# Patient Record
Sex: Male | Born: 1990 | Race: White | Hispanic: No | Marital: Single | Smoking: Former smoker
Health system: Southern US, Community
[De-identification: ages and names within clinical notes are randomized; demographics above are authoritative.]

## PROBLEM LIST (undated history)

## (undated) DIAGNOSIS — M549 Dorsalgia, unspecified: Secondary | ICD-10-CM

## (undated) HISTORY — PX: HAND SURGERY: SHX662

## (undated) HISTORY — PX: BACK SURGERY: SHX140

---
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
## 2012-07-06 ENCOUNTER — Emergency Department: Payer: Self-pay

## 2016-07-09 ENCOUNTER — Emergency Department: Payer: Worker's Compensation

## 2016-07-09 ENCOUNTER — Encounter: Payer: Self-pay | Admitting: Emergency Medicine

## 2016-07-09 ENCOUNTER — Emergency Department
Admission: EM | Admit: 2016-07-09 | Discharge: 2016-07-09 | Disposition: A | Discharge status: Home / Self Care | Payer: Worker's Compensation | Attending: Emergency Medicine | Admitting: Emergency Medicine

## 2016-07-09 DIAGNOSIS — F1210 Cannabis abuse, uncomplicated: Secondary | ICD-10-CM | POA: Diagnosis not present

## 2016-07-09 DIAGNOSIS — Y939 Activity, unspecified: Secondary | ICD-10-CM | POA: Insufficient documentation

## 2016-07-09 DIAGNOSIS — S60031A Contusion of right middle finger without damage to nail, initial encounter: Secondary | ICD-10-CM | POA: Insufficient documentation

## 2016-07-09 DIAGNOSIS — S6000XA Contusion of unspecified finger without damage to nail, initial encounter: Secondary | ICD-10-CM

## 2016-07-09 DIAGNOSIS — M79641 Pain in right hand: Secondary | ICD-10-CM | POA: Diagnosis present

## 2016-07-09 DIAGNOSIS — Y929 Unspecified place or not applicable: Secondary | ICD-10-CM | POA: Insufficient documentation

## 2016-07-09 DIAGNOSIS — W231XXA Caught, crushed, jammed, or pinched between stationary objects, initial encounter: Secondary | ICD-10-CM | POA: Diagnosis not present

## 2016-07-09 DIAGNOSIS — Y999 Unspecified external cause status: Secondary | ICD-10-CM | POA: Insufficient documentation

## 2016-07-09 MED ORDER — OXYCODONE-ACETAMINOPHEN 5-325 MG PO TABS
1.0000 | ORAL_TABLET | ORAL | 0 refills | Status: DC | PRN
Start: 2016-07-09 — End: 2017-11-13

## 2016-07-09 MED ORDER — ACETAMINOPHEN 500 MG PO TABS
1000.0000 mg | ORAL_TABLET | Freq: Once | ORAL | Status: AC
  Administered 2016-07-09: 1000 mg via ORAL
  Filled 2016-07-09: qty 2

## 2016-07-09 MED ORDER — OXYCODONE HCL 5 MG PO TABS
5.0000 mg | ORAL_TABLET | Freq: Once | ORAL | Status: AC
  Administered 2016-07-09: 5 mg via ORAL
  Filled 2016-07-09: qty 1
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
## 2016-07-09 NOTE — Discharge Instructions (Signed)
Elevate your hand. Apply ice multiple times a day. Take 600 mg of ibuprofen every 6 hours. He may take 1 Percocet every 6 hours as needed for pain that is not well controlled with ibuprofen. Follow up with your doctor in 3 days.
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
## 2016-10-07 ENCOUNTER — Emergency Department
Admission: EM | Admit: 2016-10-07 | Discharge: 2016-10-07 | Disposition: A | Discharge status: Home / Self Care | Payer: No Typology Code available for payment source | Attending: Emergency Medicine | Admitting: Emergency Medicine

## 2016-10-07 ENCOUNTER — Encounter: Payer: Self-pay | Admitting: Medical Oncology

## 2016-10-07 ENCOUNTER — Emergency Department: Payer: No Typology Code available for payment source

## 2016-10-07 DIAGNOSIS — Y999 Unspecified external cause status: Secondary | ICD-10-CM | POA: Insufficient documentation

## 2016-10-07 DIAGNOSIS — S3992XA Unspecified injury of lower back, initial encounter: Secondary | ICD-10-CM | POA: Diagnosis present

## 2016-10-07 DIAGNOSIS — S60221A Contusion of right hand, initial encounter: Secondary | ICD-10-CM | POA: Insufficient documentation

## 2016-10-07 DIAGNOSIS — S161XXA Strain of muscle, fascia and tendon at neck level, initial encounter: Secondary | ICD-10-CM | POA: Diagnosis not present

## 2016-10-07 DIAGNOSIS — Y92410 Unspecified street and highway as the place of occurrence of the external cause: Secondary | ICD-10-CM | POA: Insufficient documentation

## 2016-10-07 DIAGNOSIS — Y939 Activity, unspecified: Secondary | ICD-10-CM | POA: Insufficient documentation

## 2016-10-07 DIAGNOSIS — S39012A Strain of muscle, fascia and tendon of lower back, initial encounter: Secondary | ICD-10-CM | POA: Diagnosis not present

## 2016-10-07 MED ORDER — CYCLOBENZAPRINE HCL 10 MG PO TABS: 10.0000 mg | ORAL_TABLET | Freq: Three times a day (TID) | ORAL | 0 refills | Status: DC | PRN

## 2016-10-07 MED ORDER — OXYCODONE-ACETAMINOPHEN 5-325 MG PO TABS
1.0000 | ORAL_TABLET | Freq: Four times a day (QID) | ORAL | 0 refills | Status: DC | PRN
Start: 2016-10-07 — End: 2017-11-13

## 2016-10-07 MED ORDER — IBUPROFEN 600 MG PO TABS
600.0000 mg | ORAL_TABLET | Freq: Once | ORAL | Status: AC
  Administered 2016-10-07: 600 mg via ORAL
  Filled 2016-10-07: qty 1

## 2016-10-07 MED ORDER — IBUPROFEN 600 MG PO TABS: 600.0000 mg | ORAL_TABLET | Freq: Three times a day (TID) | ORAL | 0 refills | Status: DC | PRN

## 2016-10-07 MED ORDER — CYCLOBENZAPRINE HCL 10 MG PO TABS
10.0000 mg | ORAL_TABLET | Freq: Once | ORAL | Status: AC
  Administered 2016-10-07: 10 mg via ORAL
  Filled 2016-10-07: qty 1

## 2016-10-07 MED ORDER — IBUPROFEN 600 MG PO TABS
ORAL_TABLET | ORAL | Status: AC
  Filled 2016-10-07: qty 1

## 2016-10-07 MED ORDER — OXYCODONE-ACETAMINOPHEN 5-325 MG PO TABS
1.0000 | ORAL_TABLET | Freq: Once | ORAL | Status: AC
  Administered 2016-10-07: 1 via ORAL
  Filled 2016-10-07: qty 1
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
## 2016-10-07 NOTE — Discharge Instructions (Signed)
Wear arm sling for 2-3 days.

## 2016-10-10 ENCOUNTER — Encounter: Payer: Self-pay | Admitting: Medical Oncology

## 2016-10-10 ENCOUNTER — Emergency Department
Admission: EM | Admit: 2016-10-10 | Discharge: 2016-10-10 | Disposition: A | Discharge status: Home / Self Care | Payer: Managed Care, Other (non HMO) | Attending: Emergency Medicine | Admitting: Emergency Medicine

## 2016-10-10 DIAGNOSIS — Y9389 Activity, other specified: Secondary | ICD-10-CM | POA: Diagnosis not present

## 2016-10-10 DIAGNOSIS — Y999 Unspecified external cause status: Secondary | ICD-10-CM | POA: Diagnosis not present

## 2016-10-10 DIAGNOSIS — M62830 Muscle spasm of back: Secondary | ICD-10-CM

## 2016-10-10 DIAGNOSIS — Y92410 Unspecified street and highway as the place of occurrence of the external cause: Secondary | ICD-10-CM | POA: Insufficient documentation

## 2016-10-10 DIAGNOSIS — S3992XA Unspecified injury of lower back, initial encounter: Secondary | ICD-10-CM | POA: Diagnosis present

## 2016-10-10 MED ORDER — LIDOCAINE 5 % EX PTCH: 1.0000 | MEDICATED_PATCH | Freq: Two times a day (BID) | CUTANEOUS | 0 refills | Status: DC

## 2016-10-10 MED ORDER — KETOROLAC TROMETHAMINE 30 MG/ML IJ SOLN
30.0000 mg | Freq: Once | INTRAMUSCULAR | Status: AC
  Administered 2016-10-10: 30 mg via INTRAVENOUS
  Filled 2016-10-10: qty 1

## 2016-10-10 MED ORDER — MELOXICAM 15 MG PO TABS: 15.0000 mg | ORAL_TABLET | Freq: Every day | ORAL | 0 refills | Status: DC

## 2016-10-10 MED ORDER — ORPHENADRINE CITRATE 30 MG/ML IJ SOLN
60.0000 mg | Freq: Once | INTRAMUSCULAR | Status: AC
  Administered 2016-10-10: 60 mg via INTRAVENOUS
  Filled 2016-10-10: qty 2

## 2016-10-10 MED ORDER — METHOCARBAMOL 500 MG PO TABS: 500.0000 mg | ORAL_TABLET | Freq: Four times a day (QID) | ORAL | 0 refills | Status: DC
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
## 2017-10-23 ENCOUNTER — Emergency Department
Admission: EM | Admit: 2017-10-23 | Discharge: 2017-10-23 | Disposition: A | Discharge status: Home / Self Care | Payer: Worker's Compensation | Attending: Emergency Medicine | Admitting: Emergency Medicine

## 2017-10-23 ENCOUNTER — Other Ambulatory Visit: Payer: Self-pay

## 2017-10-23 ENCOUNTER — Encounter: Payer: Self-pay | Admitting: Emergency Medicine

## 2017-10-23 DIAGNOSIS — Y990 Civilian activity done for income or pay: Secondary | ICD-10-CM | POA: Insufficient documentation

## 2017-10-23 DIAGNOSIS — Z79899 Other long term (current) drug therapy: Secondary | ICD-10-CM | POA: Insufficient documentation

## 2017-10-23 DIAGNOSIS — X509XXA Other and unspecified overexertion or strenuous movements or postures, initial encounter: Secondary | ICD-10-CM | POA: Diagnosis not present

## 2017-10-23 DIAGNOSIS — S39012A Strain of muscle, fascia and tendon of lower back, initial encounter: Secondary | ICD-10-CM | POA: Diagnosis not present

## 2017-10-23 DIAGNOSIS — Y9389 Activity, other specified: Secondary | ICD-10-CM | POA: Insufficient documentation

## 2017-10-23 DIAGNOSIS — S3992XA Unspecified injury of lower back, initial encounter: Secondary | ICD-10-CM | POA: Diagnosis present

## 2017-10-23 DIAGNOSIS — Y9289 Other specified places as the place of occurrence of the external cause: Secondary | ICD-10-CM | POA: Diagnosis not present

## 2017-10-23 MED ORDER — ORPHENADRINE CITRATE 30 MG/ML IJ SOLN
60.0000 mg | Freq: Once | INTRAMUSCULAR | Status: AC
  Administered 2017-10-23: 60 mg via INTRAMUSCULAR
  Filled 2017-10-23: qty 2

## 2017-10-23 MED ORDER — KETOROLAC TROMETHAMINE 30 MG/ML IJ SOLN
30.0000 mg | Freq: Once | INTRAMUSCULAR | Status: AC
  Administered 2017-10-23: 30 mg via INTRAMUSCULAR
  Filled 2017-10-23: qty 1

## 2017-10-23 MED ORDER — MELOXICAM 15 MG PO TABS: 15.0000 mg | ORAL_TABLET | Freq: Every day | ORAL | 0 refills | Status: DC

## 2017-10-23 MED ORDER — OXYCODONE-ACETAMINOPHEN 5-325 MG PO TABS
1.0000 | ORAL_TABLET | Freq: Once | ORAL | Status: AC
  Administered 2017-10-23: 1 via ORAL
  Filled 2017-10-23: qty 1

## 2017-10-23 MED ORDER — METHOCARBAMOL 500 MG PO TABS: 500.0000 mg | ORAL_TABLET | Freq: Four times a day (QID) | ORAL | 0 refills | Status: DC

## 2017-10-23 NOTE — ED Notes (Signed)
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
## 2017-10-23 NOTE — ED Provider Notes (Signed)
[facility] Emergency Department Provider Note  ____________________________________________  Time seen: Approximately 10:30 PM  I have reviewed the triage vital signs and the nursing notes.   HISTORY  Chief Complaint Back Pain    HPI Chris Foster is a 26 y.o. male who presents the emergency department complaining of lower back pain with radicular symptoms down the right leg.  Patient reports that he was at work lifting some heavy tires when he felt some lower back pain.  Patient reports that the symptoms occur on a regular basis, but typically resolve after sitting down for a few minutes.  Patient reports that he continue to work on the vehicle when he had a worsening of his symptoms.  Patient reports that pain is primarily on the left side but he has radicular symptoms down the right leg.  Patient denies any bowel or bladder dysfunction, saddle anesthesia, paresthesias.  No direct trauma to lower back.  Patient has not had any medications for this complaint prior to arrival.  No other complaints at this time.  History reviewed. No pertinent past medical history.  There are no active problems to display for this patient.   Past Surgical History:  Procedure Laterality Date  . HAND SURGERY      Prior to Admission medications   Medication Sig Start Date End Date Taking? Authorizing Provider  cyclobenzaprine (FLEXERIL) 10 MG tablet Take 1 tablet (10 mg total) by mouth 3 (three) times daily as needed. 10/07/16   Joni ReiningSmith, Ronald K, PA-C  ibuprofen (ADVIL,MOTRIN) 600 MG tablet Take 1 tablet (600 mg total) by mouth every 8 (eight) hours as needed. 10/07/16   Joni ReiningSmith, Ronald K, PA-C  lidocaine (LIDODERM) 5 % Place 1 patch onto the skin every 12 (twelve) hours. Remove & Discard patch within 12 hours or as directed by MD 10/10/16   Gladine Plude, Delorise RoyalsJonathan D, PA-C  meloxicam (MOBIC) 15 MG tablet Take 1 tablet (15 mg total) daily by mouth. 10/23/17   Lavaya Defreitas, Delorise RoyalsJonathan D,  PA-C  methocarbamol (ROBAXIN) 500 MG tablet Take 1 tablet (500 mg total) 4 (four) times daily by mouth. 10/23/17   Benigno Check, Delorise RoyalsJonathan D, PA-C  oxyCODONE-acetaminophen (ROXICET) 5-325 MG tablet Take 1 tablet by mouth every 4 (four) hours as needed for severe pain. 07/09/16   Nita SickleVeronese, [place], MD  oxyCODONE-acetaminophen (ROXICET) 5-325 MG tablet Take 1 tablet by mouth every 6 (six) hours as needed for severe pain. 10/07/16   Joni ReiningSmith, Ronald K, PA-C    Allergies Hydrocodone  No family history on file.  Social History Social History   Tobacco Use  . Smoking status: Never Smoker  . Smokeless tobacco: Never Used  Substance Use Topics  . Alcohol use: No  . Drug use: Yes    Types: Marijuana     Review of Systems  Constitutional: No fever/chills Eyes: No visual changes.  Cardiovascular: no chest pain. Respiratory: no cough. No SOB. Gastrointestinal: No abdominal pain.  No nausea, no vomiting.  No diarrhea.  No constipation. Genitourinary: Negative for dysuria. No hematuria Musculoskeletal: Positive for lower back pain Skin: Negative for rash, abrasions, lacerations, ecchymosis. Neurological: Negative for headaches, focal weakness or numbness. 10-point ROS otherwise negative.  ____________________________________________   PHYSICAL EXAM:  VITAL SIGNS: ED Triage Vitals  Enc Vitals Group     BP 10/23/17 2032 108/73     Pulse Rate 10/23/17 2032 91     Resp 10/23/17 2032 20     Temp 10/23/17 2032 97.7 F (36.5 C)  Temp Source 10/23/17 2032 Oral     SpO2 10/23/17 2032 97 %     Weight 10/23/17 2033 150 lb (68 kg)     Height 10/23/17 2033 5\' 11"  (1.803 m)     Head Circumference --      Peak Flow --      Pain Score 10/23/17 2032 7     Pain Loc --      Pain Edu? --      Excl. in GC? --      Constitutional: Alert and oriented. Well appearing and in no acute distress. Eyes: Conjunctivae are normal. PERRL. EOMI. Head: Atraumatic. Neck: No stridor.    Cardiovascular:  Normal rate, regular rhythm. Normal S1 and S2.  Good peripheral circulation. Respiratory: Normal respiratory effort without tachypnea or retractions. Lungs CTAB. Good air entry to the bases with no decreased or absent breath sounds. Gastrointestinal: Bowel sounds 4 quadrants. Soft and nontender to palpation. No guarding or rigidity. No palpable masses. No distention. No CVA tenderness. Musculoskeletal: Full range of motion to all extremities. No gross deformities appreciated.  No deformity of the spine upon inspection.  No signs of trauma.  Full range of motion to the lumbar spine.  Patient is diffusely tender to palpation throughout the lumbar spine region but no palpable abnormality or step-off.  No specific point tenderness.  Patient has appreciable spasms to the left paraspinal muscle group with extreme tenderness to palpation.  Patient is nontender to palpation of right paraspinal muscle groups with no appreciable spasms.  No tenderness to palpation of her bilateral sciatic notches.  Negative straight leg raise bilaterally.  Dorsalis pedis pulse and sensation intact bilateral lower extremities. Neurologic:  Normal speech and language. No gross focal neurologic deficits are appreciated.  Skin:  Skin is warm, dry and intact. No rash noted. Psychiatric: Mood and affect are normal. Speech and behavior are normal. Patient exhibits appropriate insight and judgement.   ____________________________________________   LABS (all labs ordered are listed, but only abnormal results are displayed)  Labs Reviewed - No data to display ____________________________________________  EKG   ____________________________________________  RADIOLOGY   No results found.  ____________________________________________    PROCEDURES  Procedure(s) performed:    Procedures    Medications  ketorolac (TORADOL) 30 MG/ML injection 30 mg (not administered)  orphenadrine (NORFLEX) injection 60 mg (not  administered)  oxyCODONE-acetaminophen (PERCOCET/ROXICET) 5-325 MG per tablet 1 tablet (not administered)     ____________________________________________   INITIAL IMPRESSION / ASSESSMENT AND PLAN / ED COURSE  Pertinent labs & imaging results that were available during my care of the patient were reviewed by me and considered in my medical decision making (see chart for details).  Review of the [place] CSRS was performed in accordance of the [facility] prior to dispensing any controlled drugs.     Patient's diagnosis is consistent with lumbar strain.  Patient presents with diffuse lower back pain status post lifting heavy items at work.  Spasms are appreciated on exam.  No indication at this time for imaging.  No concerning signs or symptoms warranting imaging.  Patient is given Toradol and muscle relaxer injection in the emergency department.. Patient will be discharged home with prescriptions for meloxicam and Robaxin for symptom control. Patient is to follow up with primary care as needed or otherwise directed. Patient is given ED precautions to return to the ED for any worsening or new symptoms.     ____________________________________________  FINAL CLINICAL IMPRESSION(S) / ED DIAGNOSES  Final diagnoses:  Strain of lumbar region, initial encounter      NEW MEDICATIONS STARTED DURING THIS VISIT:  ED Discharge Orders        Ordered    meloxicam (MOBIC) 15 MG tablet  Daily     10/23/17 2243    methocarbamol (ROBAXIN) 500 MG tablet  4 times daily     10/23/17 2243          This chart was dictated using voice recognition software/Dragon. Despite best efforts to proofread, errors can occur which can change the meaning. Any change was purely unintentional.    Racheal PatchesCuthriell, Tyr Franca D, PA-C 10/23/17 2255    Arnaldo NatalMalinda, Paul F, MD 10/23/17 [phone]

## 2017-11-07 ENCOUNTER — Ambulatory Visit: Payer: Worker's Compensation | Attending: Physician Assistant | Admitting: Physical Therapy

## 2017-11-07 ENCOUNTER — Other Ambulatory Visit: Payer: Self-pay

## 2017-11-07 ENCOUNTER — Encounter: Payer: Self-pay | Admitting: Physical Therapy

## 2017-11-07 DIAGNOSIS — M6281 Muscle weakness (generalized): Secondary | ICD-10-CM | POA: Diagnosis present

## 2017-11-07 DIAGNOSIS — R262 Difficulty in walking, not elsewhere classified: Secondary | ICD-10-CM

## 2017-11-07 DIAGNOSIS — M545 Low back pain: Secondary | ICD-10-CM | POA: Diagnosis not present
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
## 2017-11-09 ENCOUNTER — Ambulatory Visit: Payer: Worker's Compensation | Attending: Physician Assistant | Admitting: Physical Therapy

## 2017-11-09 ENCOUNTER — Encounter: Payer: Self-pay | Admitting: Physical Therapy

## 2017-11-09 DIAGNOSIS — M6281 Muscle weakness (generalized): Secondary | ICD-10-CM | POA: Insufficient documentation

## 2017-11-09 DIAGNOSIS — M545 Low back pain: Secondary | ICD-10-CM | POA: Diagnosis not present

## 2017-11-09 DIAGNOSIS — R262 Difficulty in walking, not elsewhere classified: Secondary | ICD-10-CM | POA: Diagnosis present
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
## 2017-11-13 ENCOUNTER — Other Ambulatory Visit: Payer: Self-pay

## 2017-11-13 ENCOUNTER — Emergency Department
Admission: EM | Admit: 2017-11-13 | Discharge: 2017-11-13 | Disposition: A | Discharge status: Home / Self Care | Payer: Worker's Compensation | Attending: Emergency Medicine | Admitting: Emergency Medicine

## 2017-11-13 ENCOUNTER — Emergency Department: Payer: Worker's Compensation

## 2017-11-13 ENCOUNTER — Encounter: Payer: Self-pay | Admitting: Emergency Medicine

## 2017-11-13 ENCOUNTER — Encounter: Payer: Self-pay | Admitting: Physical Therapy

## 2017-11-13 ENCOUNTER — Ambulatory Visit: Payer: Worker's Compensation | Attending: Physician Assistant

## 2017-11-13 VITALS — BP 108/69 | HR 114

## 2017-11-13 DIAGNOSIS — M6281 Muscle weakness (generalized): Secondary | ICD-10-CM | POA: Diagnosis present

## 2017-11-13 DIAGNOSIS — X58XXXD Exposure to other specified factors, subsequent encounter: Secondary | ICD-10-CM | POA: Insufficient documentation

## 2017-11-13 DIAGNOSIS — Z79899 Other long term (current) drug therapy: Secondary | ICD-10-CM | POA: Diagnosis not present

## 2017-11-13 DIAGNOSIS — R262 Difficulty in walking, not elsewhere classified: Secondary | ICD-10-CM | POA: Diagnosis present

## 2017-11-13 DIAGNOSIS — S39012D Strain of muscle, fascia and tendon of lower back, subsequent encounter: Secondary | ICD-10-CM | POA: Diagnosis not present

## 2017-11-13 DIAGNOSIS — M545 Low back pain: Secondary | ICD-10-CM | POA: Diagnosis not present

## 2017-11-13 MED ORDER — BACLOFEN 10 MG PO TABS: 10.0000 mg | ORAL_TABLET | Freq: Every day | ORAL | 1 refills | Status: DC

## 2017-11-13 MED ORDER — PREDNISONE 10 MG (48) PO TBPK: ORAL_TABLET | ORAL | 0 refills | Status: DC

## 2017-11-13 MED ORDER — TRAMADOL HCL 50 MG PO TABS: 50.0000 mg | ORAL_TABLET | Freq: Four times a day (QID) | ORAL | 0 refills | Status: DC | PRN

## 2017-11-13 MED ORDER — MORPHINE SULFATE (PF) 4 MG/ML IV SOLN
4.0000 mg | Freq: Once | INTRAVENOUS | Status: AC
  Administered 2017-11-13: 4 mg via INTRAVENOUS
  Filled 2017-11-13: qty 1

## 2017-11-13 NOTE — ED Provider Notes (Signed)
[facility] Emergency Department Provider Note  ____________________________________________   First MD Initiated Contact with Patient 11/13/17 1127     (approximate)  I have reviewed the triage vital signs and the nursing notes.   HISTORY  Chief Complaint Back Pain    HPI Chris Foster is a 26 y.o. 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 No pertinent past medical history.  There are no active problems to display for this patient.   Past Surgical History:  Procedure Laterality Date  . HAND SURGERY      Prior to Admission medications   Medication Sig Start Date End Date Taking? Authorizing Provider  baclofen (LIORESAL) 10 MG tablet Take 1 tablet (10 mg total) by mouth daily. 11/13/17 11/13/18  Faythe GheeFisher, Ireland Chagnon W, PA-C  meloxicam (MOBIC) 15 MG tablet Take 1 tablet (15 mg total) daily by mouth. 10/23/17   Cuthriell, Delorise RoyalsJonathan D, PA-C  methocarbamol (ROBAXIN) 500 MG tablet Take 1 tablet (500 mg total) 4 (four) times daily by mouth. 10/23/17   Cuthriell, Delorise RoyalsJonathan D, PA-C  predniSONE (STERAPRED UNI-PAK 48 TAB) 10 MG (48) TBPK tablet Take 6 pills for 2 days then decrease by 1 pill every 2 days 11/13/17   Faythe GheeFisher, Rubylee Zamarripa W, PA-C  traMADol (ULTRAM) 50 MG tablet Take 1 tablet (50 mg total) by mouth every 6 (six) hours as needed. 11/13/17   Faythe GheeFisher, Dae Highley W, PA-C    Allergies Hydrocodone and Toradol [ketorolac tromethamine]  No  family history on file.  Social History Social History   Tobacco Use  . Smoking status: Never Smoker  . Smokeless tobacco: Never Used  Substance Use Topics  . Alcohol use: No  . Drug use: Yes    Types: Marijuana    Review of Systems  Constitutional: No fever/chills, no change in speech patterns, states his entire left side went numb Eyes: No visual changes. ENT: No sore throat. Respiratory: Denies cough Genitourinary: Negative for dysuria. Musculoskeletal: Positive for back pain. Skin: Negative for rash.    ____________________________________________   PHYSICAL EXAM:  VITAL SIGNS: ED Triage Vitals [11/13/17 1026]  Enc Vitals Group     BP 112/75     Pulse Rate (!) 108     Resp 18     Temp 99.1 F (37.3 C)     Temp Source Oral     SpO2 97 %     Weight 150 lb (68 kg)     Height 5\' 11"  (1.803 m)     Head Circumference      Peak Flow      Pain Score 9     Pain Loc      Pain Edu?      Excl. in GC?     Constitutional: Alert and oriented. Well appearing and in no acute distress. Eyes: Conjunctivae are normal.  Head: Atraumatic. Nose: No congestion/rhinnorhea. Mouth/Throat: Mucous membranes are moist.   Cardiovascular: Normal  rate, regular rhythm. Heart sounds are normal Respiratory: Normal respiratory effort.  No retractions lungs are clear to auscultation GU: deferred Musculoskeletal: Vision has decreased range of motion, pain is reproduced by rolling onto the left side for spine examination, patient has weak efforts in pressing on hands with his feet, is able to dorsiflex his toes,  Neurologic:  Normal speech and language.  Skin:  Skin is warm, dry and intact. No rash noted. Psychiatric: Mood and affect are normal. Speech and behavior are normal.  ____________________________________________   LABS (all labs ordered are listed, but only abnormal results are displayed)  Labs Reviewed - No data to  display ____________________________________________   ____________________________________________  RADIOLOGY  X-ray lumbar spine is negative MRI of lumbar spine is ordered ----------------------------------------- 1:56 PM on 11/13/2017 -----------------------------------------  MRI of the lumbar spine is negative  ____________________________________________   PROCEDURES  Procedure(s) performed: No      ____________________________________________   INITIAL IMPRESSION / ASSESSMENT AND PLAN / ED COURSE  Pertinent labs & imaging results that were available during my care of the patient were reviewed by me and considered in my medical decision making (see chart for details).  Patient was sent to the emergency department by physical therapy via EMS due to patient going numb on his left side, x-ray of the lumbar spine is negative, MRI of lumbar spine is ordered, ----------------------------------------- 1:56 PM on 11/13/2017 -----------------------------------------  Patient is alert and well, he had pain relief with 4 mg of morphine, MRI and x-rays are negative, prescription for Sterapred DS 10 mg 12 day Dosepak was given, baclofen 10 mg 3 times a day, tramadol 50 mg #30 with no refill was given, patient was given a referral for orthopedics, explained to him that he needs to check with Worker's Comp. to make sure they will approve him using the orthopedic he was referred to, he can follow-up with the Worker's Comp. clinic [place];      ____________________________________________   FINAL CLINICAL IMPRESSION(S) / ED DIAGNOSES  Final diagnoses:  Strain of lumbar region, subsequent encounter      NEW MEDICATIONS STARTED DURING THIS VISIT:  This SmartLink is deprecated. Use AVSMEDLIST instead to display the medication list for a patient.   Note:  This document was prepared using Dragon voice recognition software and may include unintentional dictation  errors.    Faythe GheeFisher, Stella Encarnacion W, PA-C 11/13/17 1358    Jene EveryKinner, Robert, MD 11/13/17 [phone]

## 2017-11-13 NOTE — ED Triage Notes (Addendum)
Pt arrived via EMS from PT for severe back pain. Pt reports PT was pressing on lower spine and his left side went numb. Pt reports feeling has returned with the exception of left foot and left hand. Pt reports injured back three weeks ago and has been going to PT. Pt reports was feeling dizzy at PT while waiting for EMS to get there. No apparent distress in triage, joking with girlfriend during triage.

## 2017-11-13 NOTE — Discharge Instructions (Signed)
Follow-up with Dr.Menz or an orthopedic of your Worker's Comp. Choice, use the medication as prescribed, your x-ray and MRI are negative, you may return to physical therapy as needed, apply ice to your back

## 2017-11-13 NOTE — ED Notes (Signed)
Brought back from MRI

## 2017-11-13 NOTE — ED Notes (Signed)
See triage note  Presents with some numbness to left side  States sx's started while at PT this am  States he felt this numbness to left side after being massaged by PT  States feeling is returning at left side  Initial injury to back was 11/12 at work
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
## 2017-11-13 NOTE — ED Notes (Signed)
Taken to MRI via stretcher 

## 2017-11-13 NOTE — ED Notes (Signed)
Pt says pain about 3/10.  In nad.

## 2017-11-13 NOTE — ED Notes (Signed)
Awaiting ride

## 2017-11-15 ENCOUNTER — Encounter: Payer: Self-pay | Admitting: Physical Therapy

## 2017-11-15 ENCOUNTER — Ambulatory Visit: Payer: Worker's Compensation | Admitting: Physical Therapy

## 2017-11-15 DIAGNOSIS — R262 Difficulty in walking, not elsewhere classified: Secondary | ICD-10-CM

## 2017-11-15 DIAGNOSIS — M6281 Muscle weakness (generalized): Secondary | ICD-10-CM

## 2017-11-15 DIAGNOSIS — M545 Low back pain: Secondary | ICD-10-CM
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
## 2018-04-02 ENCOUNTER — Encounter: Payer: Self-pay | Admitting: Emergency Medicine

## 2018-04-02 ENCOUNTER — Emergency Department
Admission: EM | Admit: 2018-04-02 | Discharge: 2018-04-02 | Disposition: A | Discharge status: Home / Self Care | Payer: Self-pay | Attending: Emergency Medicine | Admitting: Emergency Medicine

## 2018-04-02 DIAGNOSIS — S025XXA Fracture of tooth (traumatic), initial encounter for closed fracture: Secondary | ICD-10-CM | POA: Insufficient documentation

## 2018-04-02 DIAGNOSIS — Y929 Unspecified place or not applicable: Secondary | ICD-10-CM | POA: Insufficient documentation

## 2018-04-02 DIAGNOSIS — Y998 Other external cause status: Secondary | ICD-10-CM | POA: Insufficient documentation

## 2018-04-02 DIAGNOSIS — Z79899 Other long term (current) drug therapy: Secondary | ICD-10-CM | POA: Insufficient documentation

## 2018-04-02 DIAGNOSIS — K0889 Other specified disorders of teeth and supporting structures: Secondary | ICD-10-CM

## 2018-04-02 DIAGNOSIS — Y9389 Activity, other specified: Secondary | ICD-10-CM | POA: Insufficient documentation

## 2018-04-02 DIAGNOSIS — Y33XXXA Other specified events, undetermined intent, initial encounter: Secondary | ICD-10-CM | POA: Insufficient documentation

## 2018-04-02 MED ORDER — IBUPROFEN 600 MG PO TABS: 600.0000 mg | ORAL_TABLET | Freq: Three times a day (TID) | ORAL | 0 refills | Status: DC | PRN

## 2018-04-02 MED ORDER — TRAMADOL HCL 50 MG PO TABS: 50.0000 mg | ORAL_TABLET | Freq: Two times a day (BID) | ORAL | 0 refills | Status: DC | PRN

## 2018-04-02 MED ORDER — AMOXICILLIN 500 MG PO CAPS: 500.0000 mg | ORAL_CAPSULE | Freq: Three times a day (TID) | ORAL | 0 refills | Status: DC

## 2018-04-02 NOTE — ED Provider Notes (Signed)
[facility] Emergency Department Provider Note   ____________________________________________   First MD Initiated Contact with Patient 04/02/18 1512     (approximate)  I have reviewed the triage vital signs and the nursing notes.   HISTORY  Chief Complaint Dental Pain    HPI Chris Foster is a 27 y.o. male patient complained of dental pain secondary to old fractured tooth right upper molar area.  Patient state pain comes and go but today he contused his face against elbow while changing a tire.  Patient rates the pain as a 10/10.  Patient described the pain as "acute aching".  No palates measured for complaint.  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  History reviewed. No pertinent past medical history.  There are no active problems to display for this patient.   Past Surgical History:  Procedure Laterality Date  . HAND SURGERY      Prior to Admission medications   Medication Sig Start Date End Date Taking? Authorizing Provider  amoxicillin (AMOXIL) 500 MG capsule Take 1 capsule (500 mg total) by mouth 3 (three) times daily. 04/02/18   Joni ReiningSmith, Cassandria Drew K, PA-C  baclofen (LIORESAL) 10 MG tablet Take 1 tablet (10 mg total) by mouth daily. 11/13/17 11/13/18  Fisher, Roselyn BeringSusan W, PA-C  ibuprofen (ADVIL,MOTRIN) 600 MG tablet Take 1 tablet (600 mg total) by mouth every 8 (eight) hours as needed. 04/02/18   Joni ReiningSmith, Aarti Mankowski K, PA-C  meloxicam (MOBIC) 15 MG tablet Take 1 tablet (15 mg total) daily by mouth. 10/23/17   Cuthriell, Delorise RoyalsJonathan D, PA-C  methocarbamol (ROBAXIN) 500 MG tablet Take 1 tablet (500 mg total) 4 (four) times daily by mouth. 10/23/17   Cuthriell, Delorise RoyalsJonathan D, PA-C  predniSONE (STERAPRED UNI-PAK 48 TAB) 10 MG (48) TBPK tablet Take 6 pills for 2 days then decrease by 1 pill every 2 days 11/13/17   Faythe GheeFisher, Susan W, PA-C  traMADol (ULTRAM) 50 MG tablet Take 1 tablet (50 mg total) by mouth every 6 (six)  hours as needed. 11/13/17   Fisher, Roselyn BeringSusan W, PA-C  traMADol (ULTRAM) 50 MG tablet Take 1 tablet (50 mg total) by mouth every 12 (twelve) hours as needed. 04/02/18   Joni ReiningSmith, Kathren Scearce K, PA-C    Allergies Hydrocodone and Toradol [ketorolac tromethamine]  No family history on file.  Social History Social History   Tobacco Use  . Smoking status: Never Smoker  . Smokeless tobacco: Never Used  Substance Use Topics  . Alcohol use: No  . Drug use: Yes    Types: Marijuana    Review of Systems  Constitutional: No fever/chills Eyes: No visual changes. ENT: No sore throat.  Dental pain. Cardiovascular: Denies chest pain. Respiratory: Denies shortness of breath. Gastrointestinal: No abdominal pain.  No nausea, no vomiting.  No diarrhea.  No constipation. Genitourinary: Negative for dysuria. Musculoskeletal: Negative for back pain. Skin: Negative for rash. Neurological: Positive for headaches, denies focal weakness or numbness. Allergic/Immunilogical: Hydrocodone and Toradol.  ____________________________________________   PHYSICAL EXAM:  VITAL SIGNS: ED Triage Vitals [04/02/18 1506]  Enc Vitals Group     BP 121/80     Pulse Rate 72     Resp 16     Temp 98.7 F (37.1 C)     Temp Source Oral     SpO2 100 %     Weight 150 lb (68 kg)     Height 5\' 11"  (1.803 m)     Head Circumference  Peak Flow      Pain Score 10     Pain Loc      Pain Edu?      Excl. in GC?    Constitutional: Alert and oriented. Well appearing and in no acute distress. Mouth/Throat: Mucous membranes are moist.  Oropharynx non-erythematous.  Fractured tooth #3. Neck: No stridor.  Hematological/Lymphatic/Immunilogical: No cervical lymphadenopathy. Cardiovascular: Normal rate, regular rhythm. Grossly normal heart sounds.  Good peripheral circulation. Respiratory: Normal respiratory effort.  No retractions. Lungs CTAB. Neurologic:  Normal speech and language. No gross focal neurologic deficits are  appreciated. No gait instability. Skin:  Skin is warm, dry and intact. No rash noted. Psychiatric: Mood and affect are normal. Speech and behavior are normal.  ____________________________________________   LABS (all labs ordered are listed, but only abnormal results are displayed)  Labs Reviewed - No data to display ____________________________________________  EKG   ____________________________________________  RADIOLOGY  ED MD interpretation:    Official radiology report(s): No results found.  ____________________________________________   PROCEDURES  Procedure(s) performed: None  Procedures  Critical Care performed: No  ____________________________________________   INITIAL IMPRESSION / ASSESSMENT AND PLAN / ED COURSE  As part of my medical decision making, I reviewed the following data within the electronic MEDICAL RECORD NUMBER    Dental pain secondary to old fractured tooth.  Patient given discharge care instruction.  Patient given list of dental clinics for follow-up care.  Advised take medication as directed.      ____________________________________________   FINAL CLINICAL IMPRESSION(S) / ED DIAGNOSES  Final diagnoses:  Closed fracture of tooth, initial encounter  Pain, dental     ED Discharge Orders        Ordered    amoxicillin (AMOXIL) 500 MG capsule  3 times daily     04/02/18 1517    traMADol (ULTRAM) 50 MG tablet  Every 12 hours PRN     04/02/18 1517    ibuprofen (ADVIL,MOTRIN) 600 MG tablet  Every 8 hours PRN     04/02/18 1517       Note:  This document was prepared using Dragon voice recognition software and may include unintentional dictation errors.    Joni Reining, PA-C 04/02/18 1522    Jene Every, MD 04/06/18 2201

## 2018-04-02 NOTE — ED Triage Notes (Signed)
Pt reports L upper dental pain that radiates into head.

## 2018-04-02 NOTE — Discharge Instructions (Addendum)
Follow-up from list of dental clinics provided. °OPTIONS FOR DENTAL FOLLOW UP CARE ° °[place] [facility] and Human Services - Local Safety Net Dental Clinics °http://www.ncdhhs.gov/dph/oralhealth/services/safetynetclinics.htm °  °[facility] (336-562-3123) ° °[facility] (919-933-9087) ° °[facility] (919-663-1744 ext [phone]) ° °[facility] ([phone]) ° °[facility] ([phone]) °This clinic caters to the indigent population and is on a lottery system. °Location: °[facility], [address], [place] °Clinic Hours: °Wednesdays from 6pm - 9pm, patients seen by a lottery system. °For dates, call or go to www.med.[facility].edu/[facility]/patients/Dental-[facility] °Services: °Cleanings, fillings and simple extractions. °Payment Options: °DENTAL WORK IS FREE OF CHARGE. Bring proof of income or support. °Best way to get seen: °Arrive at 5:15 pm - this is a lottery, NOT first come/first serve, so arriving earlier will not increase your chances of being seen. °  °  °[facility] °919-537-3737 °Select option 1 for emergencies °  °Location: °[facility], [address], [place] °Clinic Hours: °No walk-ins accepted - call the day before to schedule an appointment. °Check in times are 9:30 am and 1:30 pm. °Services: °Simple extractions, temporary fillings, pulpectomy/pulp debridement, uncomplicated abscess drainage. °Payment Options: °PAYMENT IS DUE AT THE TIME OF SERVICE.  Fee is usually $100-200, additional surgical procedures (e.g. abscess drainage) may be extra. °Cash, checks, Visa/MasterCard accepted.  Can file Medicaid if patient is covered for dental - patient should call case worker to check. °No discount for [facility] patients. °Best way to get seen: °MUST call the day before and get onto the schedule. Can usually be seen the next 1-2 days. No walk-ins accepted. °  °   °[facility] °[phone] °  °Location: °[facility], [address], [place] °Clinic Hours: °M, W, Th, F 8am or 1:30pm, Tues 9a or 1:30 - first come/first served. °Services: °Simple extractions, temporary fillings, uncomplicated abscess drainage.  You do not need to be an [place] resident. °Payment Options: °PAYMENT IS DUE AT THE TIME OF SERVICE. °Dental insurance, otherwise sliding scale - bring proof of income or support. °Depending on income and treatment needed, cost is usually $50-200. °Best way to get seen: °Arrive early as it is first come/first served. °  °  °[facility] °[phone] °  °Location: °[address] °Clinic Hours: °Mon-Thu 8a-5p °Services: °Most basic dental services including extractions and fillings. °Payment Options: °PAYMENT IS DUE AT THE TIME OF SERVICE. °Sliding scale, up to 50% off - bring proof if income or support. °Medicaid with dental option accepted. °Best way to get seen: °Call to schedule an appointment, can usually be seen within 2 weeks OR they will try to see walk-ins - show up at 8a or 2p (you may have to wait). °  °  °[facility] °919-245-2435 °[place] RESIDENTS ONLY °  °Location: °[facility], [address], [place], [place] [address] °Clinic Hours: By appointment only. °Monday - Thursday 8am-5pm, Friday 8am-12pm °Services: Cleanings, fillings, extractions. °Payment Options: °PAYMENT IS DUE AT THE TIME OF SERVICE. °Cash, Visa or MasterCard. Sliding scale - $30 minimum per service. °Best way to get seen: °Come in to office, complete packet and make an appointment - need proof of income °or support monies for each household member and proof of [place] residence. °Usually takes about a month to get in. °  °  °[facility] °  [phone] °  °Location: °[address] [place] °Clinic Hours: Walk-in Urgent Care  Dental Services are offered Monday-Friday mornings only. °The numbers of emergencies accepted daily is limited to the number of °providers available. °Maximum 15 - Mondays, Wednesdays & Thursdays °Maximum 10 - Tuesdays & Fridays °Services: °You do not need to be a [place] resident to be seen for a dental emergency. °Emergencies are defined as pain, swelling, abnormal bleeding, or dental trauma. Walkins will receive x-rays if needed. °NOTE: Dental cleaning is not an emergency. °Payment Options: °PAYMENT IS DUE AT THE TIME OF SERVICE. °Minimum co-pay is $40.00 for uninsured patients. °Minimum co-pay is $3.00 for Medicaid with dental coverage. °Dental Insurance is accepted and must be presented at time of visit. °Medicare does not cover dental. °Forms of payment: Cash, credit card, checks. °Best way to get seen: °If not previously registered with the clinic, walk-in dental registration begins at 7:15 am and is on a first come/first serve basis. °If previously registered with the clinic, call to make an appointment. °  °  °[facility] °919-776-4359 °[place] RESIDENTS ONLY °  °Location: °[address], [place], [place] °Clinic Hours: °Mon-Thu 10a-2p °Services: Extractions only! °Payment Options: °FREE (donations accepted) - bring proof of income or support °Best way to get seen: °Call and schedule an appointment OR come at 8am on the 1st Monday of every month (except for holidays) when it is first come/first served. °  °  °[facility] °[phone] °  °Location: °[address], [place] °Clinic Hours: °Friday mornings °Services, Payment Options, Best way to get seen: °Call for info ° °

## 2018-04-02 NOTE — ED Notes (Signed)

## 2018-09-15 ENCOUNTER — Encounter: Payer: Self-pay | Admitting: Emergency Medicine

## 2018-09-15 ENCOUNTER — Emergency Department
Admission: EM | Admit: 2018-09-15 | Discharge: 2018-09-15 | Disposition: A | Discharge status: Home / Self Care | Payer: PRIVATE HEALTH INSURANCE | Attending: Emergency Medicine | Admitting: Emergency Medicine

## 2018-09-15 ENCOUNTER — Other Ambulatory Visit: Payer: Self-pay

## 2018-09-15 DIAGNOSIS — Z79899 Other long term (current) drug therapy: Secondary | ICD-10-CM | POA: Diagnosis not present

## 2018-09-15 DIAGNOSIS — M545 Low back pain: Secondary | ICD-10-CM | POA: Diagnosis present

## 2018-09-15 DIAGNOSIS — G8929 Other chronic pain: Secondary | ICD-10-CM

## 2018-09-15 DIAGNOSIS — M5442 Lumbago with sciatica, left side: Secondary | ICD-10-CM | POA: Diagnosis not present

## 2018-09-15 DIAGNOSIS — F1210 Cannabis abuse, uncomplicated: Secondary | ICD-10-CM | POA: Insufficient documentation

## 2018-09-15 MED ORDER — METHOCARBAMOL 500 MG PO TABS: 500.0000 mg | ORAL_TABLET | Freq: Four times a day (QID) | ORAL | 0 refills | Status: DC

## 2018-09-15 MED ORDER — MELOXICAM 15 MG PO TABS: 15.0000 mg | ORAL_TABLET | Freq: Every day | ORAL | 0 refills | Status: DC

## 2018-09-15 MED ORDER — OXYCODONE-ACETAMINOPHEN 5-325 MG PO TABS: 1.0000 | ORAL_TABLET | Freq: Four times a day (QID) | ORAL | 0 refills | Status: DC | PRN

## 2018-09-15 MED ORDER — OXYCODONE-ACETAMINOPHEN 5-325 MG PO TABS
1.0000 | ORAL_TABLET | Freq: Once | ORAL | Status: AC
  Administered 2018-09-15: 1 via ORAL
  Filled 2018-09-15: qty 1

## 2018-09-15 MED ORDER — DEXAMETHASONE SODIUM PHOSPHATE 10 MG/ML IJ SOLN
10.0000 mg | Freq: Once | INTRAMUSCULAR | Status: AC
  Administered 2018-09-15: 10 mg via INTRAMUSCULAR
  Filled 2018-09-15: qty 1

## 2018-09-15 MED ORDER — ORPHENADRINE CITRATE 30 MG/ML IJ SOLN
60.0000 mg | Freq: Once | INTRAMUSCULAR | Status: AC
  Administered 2018-09-15: 60 mg via INTRAMUSCULAR
  Filled 2018-09-15: qty 2

## 2018-09-15 NOTE — ED Triage Notes (Signed)
Pt to ED via POV, pt states that he is having lower back pain and bilateral hip pain. Pt was able to ambulate to triage without difficulty.

## 2018-09-15 NOTE — ED Provider Notes (Signed)
[facility] Emergency Department Provider Note  ____________________________________________  Time seen: Approximately 6:50 PM  I have reviewed the triage vital signs and the nursing notes.   HISTORY  Chief Complaint Back Pain    HPI Chris Foster is a 27 y.o. male who presents the emergency department complaining of lower back pain radiating into the left hip.  Patient reports that he had an injury at work approximately a year ago and has had ongoing lower back pain since.  Patient states that the injury was due to lifting heavy tires.  He developed lower back pain with some radiation.  He went through physical therapy, multiple rounds of medication and states that symptoms improved somewhat.  But patient reports that after going back to his full-time hours he has had nagging and now increasing lower back pain.  Patient has seen orthopedics and is scheduled to see neurosurgery.  He was given a prescription for Percocet, 3 days worth approximately a month ago.  Patient reports that he takes the Percocet only as needed, most times to help him sleep.  Patient reports that he has had an increase in his lower back pain over the past several days without new injury.  No urinary symptoms.  Patient reports that he is out of his medication, he called the on-call orthopedic provider and was referred to the emergency department.  Patient has no bowel or bladder dysfunction, saddle anesthesia, paresthesias.  Patient states that he is awaiting referral to neurosurgery.  Patient is requesting medication to allow him to be able to sleep at night due to the pain.    History reviewed. No pertinent past medical history.  There are no active problems to display for this patient.   Past Surgical History:  Procedure Laterality Date  . HAND SURGERY      Prior to Admission medications   Medication Sig Start Date End Date Taking? Authorizing Provider  amoxicillin (AMOXIL) 500 MG  capsule Take 1 capsule (500 mg total) by mouth 3 (three) times daily. 04/02/18   Joni Reining, PA-C  baclofen (LIORESAL) 10 MG tablet Take 1 tablet (10 mg total) by mouth daily. 11/13/17 11/13/18  Fisher, Roselyn Bering, PA-C  ibuprofen (ADVIL,MOTRIN) 600 MG tablet Take 1 tablet (600 mg total) by mouth every 8 (eight) hours as needed. 04/02/18   Joni Reining, PA-C  meloxicam (MOBIC) 15 MG tablet Take 1 tablet (15 mg total) by mouth daily. 09/15/18   Florina Glas, Delorise Royals, PA-C  methocarbamol (ROBAXIN) 500 MG tablet Take 1 tablet (500 mg total) by mouth 4 (four) times daily. 09/15/18   Addyson Traub, Delorise Royals, PA-C  oxyCODONE-acetaminophen (PERCOCET/ROXICET) 5-325 MG tablet Take 1 tablet by mouth every 6 (six) hours as needed for severe pain. 09/15/18   Tryton Bodi, Delorise Royals, PA-C  predniSONE (STERAPRED UNI-PAK 48 TAB) 10 MG (48) TBPK tablet Take 6 pills for 2 days then decrease by 1 pill every 2 days 11/13/17   Faythe Ghee, PA-C  traMADol (ULTRAM) 50 MG tablet Take 1 tablet (50 mg total) by mouth every 6 (six) hours as needed. 11/13/17   Fisher, Roselyn Bering, PA-C  traMADol (ULTRAM) 50 MG tablet Take 1 tablet (50 mg total) by mouth every 12 (twelve) hours as needed. 04/02/18   Joni Reining, PA-C    Allergies Hydrocodone and Toradol [ketorolac tromethamine]  No family history on file.  Social History Social History   Tobacco Use  . Smoking status: Never Smoker  . Smokeless tobacco: Never  Used  Substance Use Topics  . Alcohol use: No  . Drug use: Yes    Types: Marijuana     Review of Systems  Constitutional: No fever/chills Eyes: No visual changes.  Cardiovascular: no chest pain. Respiratory: no cough. No SOB. Gastrointestinal: No abdominal pain.  No nausea, no vomiting.  No diarrhea.  No constipation. Genitourinary: Negative for dysuria. No hematuria Musculoskeletal: Positive for lower back pain radiating into the left hip. Skin: Negative for rash, abrasions, lacerations,  ecchymosis. Neurological: Negative for headaches, focal weakness or numbness. 10-point ROS otherwise negative.  ____________________________________________   PHYSICAL EXAM:  VITAL SIGNS: ED Triage Vitals  Enc Vitals Group     BP 09/15/18 1827 121/73     Pulse Rate 09/15/18 1825 87     Resp 09/15/18 1825 16     Temp 09/15/18 1825 99.1 F (37.3 C)     Temp Source 09/15/18 1825 Oral     SpO2 09/15/18 1825 100 %     Weight 09/15/18 1826 157 lb (71.2 kg)     Height 09/15/18 1826 5\' 11"  (1.803 m)     Head Circumference --      Peak Flow --      Pain Score 09/15/18 1826 9     Pain Loc --      Pain Edu? --      Excl. in GC? --      Constitutional: Alert and oriented. Well appearing and in no acute distress. Eyes: Conjunctivae are normal. PERRL. EOMI. Head: Atraumatic. Neck: No stridor.  No cervical spine tenderness to palpation.  Cardiovascular: Normal rate, regular rhythm. Normal S1 and S2.  Good peripheral circulation. Respiratory: Normal respiratory effort without tachypnea or retractions. Lungs CTAB. Good air entry to the bases with no decreased or absent breath sounds. Gastrointestinal: Bowel sounds 4 quadrants. Soft and nontender to palpation. No guarding or rigidity. No palpable masses. No distention. No CVA tenderness. Musculoskeletal: Full range of motion to all extremities. No gross deformities appreciated.  Visualization of the lumbar spine reveals no abnormality or deformity.  Diffuse tenderness to palpation without point specific tenderness.  No palpable abnormality or step-off.  No tenderness to palpation of bilateral sciatic notches.  Negative straight leg raise bilaterally.  Dorsalis pedis pulse intact bilateral lower extremities.  Sensation intact and equal bilateral lower extremities. Neurologic:  Normal speech and language. No gross focal neurologic deficits are appreciated.  Skin:  Skin is warm, dry and intact. No rash noted. Psychiatric: Mood and affect are  normal. Speech and behavior are normal. Patient exhibits appropriate insight and judgement.   ____________________________________________   LABS (all labs ordered are listed, but only abnormal results are displayed)  Labs Reviewed - No data to display ____________________________________________  EKG   ____________________________________________  RADIOLOGY  Previous x-rays and MRI from December 2018 were reviewed.  No results found.  ____________________________________________    PROCEDURES  Procedure(s) performed:    Procedures    Medications  dexamethasone (DECADRON) injection 10 mg (has no administration in time range)  orphenadrine (NORFLEX) injection 60 mg (has no administration in time range)  oxyCODONE-acetaminophen (PERCOCET/ROXICET) 5-325 MG per tablet 1 tablet (has no administration in time range)     ____________________________________________   INITIAL IMPRESSION / ASSESSMENT AND PLAN / ED COURSE  Pertinent labs & imaging results that were available during my care of the patient were reviewed by me and considered in my medical decision making (see chart for details).  Review of the [place] CSRS was performed  in accordance of the [facility] prior to dispensing any controlled drugs.      Patient's diagnosis is consistent with patient presented to the emergency department with worsening lower back pain.  This is been ongoing for approximately a year.  Review of MRI from 2018 reveals no degeneration, causing her herniated disc.  No concerning symptoms at this time warranting repeat imaging.  Patient is looking for medication for symptom control.  I did review the controlled substance database.  Patient is upfront and accurate with his prescriptions.  Patient has received narcotics from the emergency department as well as orthopedics.  Patient has stretched his last 5-day prescription over almost a month.  As such, I will give patient Decadron, Norflex injections  in a Percocet in the emergency department.  I will prescribe a 5-day prescription for Percocet until patient may follow-up with neurosurgery. Patient is given ED precautions to return to the ED for any worsening or new symptoms.     ____________________________________________  FINAL CLINICAL IMPRESSION(S) / ED DIAGNOSES  Final diagnoses:  Chronic midline low back pain with left-sided sciatica      NEW MEDICATIONS STARTED DURING THIS VISIT:  ED Discharge Orders         Ordered    oxyCODONE-acetaminophen (PERCOCET/ROXICET) 5-325 MG tablet  Every 6 hours PRN     09/15/18 1933    meloxicam (MOBIC) 15 MG tablet  Daily     09/15/18 1933    methocarbamol (ROBAXIN) 500 MG tablet  4 times daily     09/15/18 1933              This chart was dictated using voice recognition software/Dragon. Despite best efforts to proofread, errors can occur which can change the meaning. Any change was purely unintentional.    Racheal Patches, PA-C 09/15/18 1934    Arnaldo Natal, MD 09/16/18 [phone]

## 2018-09-15 NOTE — ED Notes (Signed)
Back pain  For  A long time-  Denies any recent injury  Pain has become  Worse  Lately   Pt reports  Numbness and tingling down l leg  Pt Has pain on weight bearing  denys any loss of bowel  Or bladder  Function

## 2018-10-03 ENCOUNTER — Emergency Department
Admission: EM | Admit: 2018-10-03 | Discharge: 2018-10-03 | Disposition: A | Discharge status: Home / Self Care | Payer: PRIVATE HEALTH INSURANCE | Attending: Emergency Medicine | Admitting: Emergency Medicine

## 2018-10-03 ENCOUNTER — Other Ambulatory Visit: Payer: Self-pay

## 2018-10-03 ENCOUNTER — Encounter: Payer: Self-pay | Admitting: Emergency Medicine

## 2018-10-03 DIAGNOSIS — M5442 Lumbago with sciatica, left side: Secondary | ICD-10-CM | POA: Insufficient documentation

## 2018-10-03 DIAGNOSIS — G8929 Other chronic pain: Secondary | ICD-10-CM | POA: Diagnosis not present

## 2018-10-03 DIAGNOSIS — F1210 Cannabis abuse, uncomplicated: Secondary | ICD-10-CM | POA: Insufficient documentation

## 2018-10-03 DIAGNOSIS — M545 Low back pain: Secondary | ICD-10-CM | POA: Diagnosis present

## 2018-10-03 MED ORDER — BACLOFEN 10 MG PO TABS: ORAL_TABLET | ORAL | 0 refills | Status: DC

## 2018-10-03 MED ORDER — IBUPROFEN 600 MG PO TABS: 600.0000 mg | ORAL_TABLET | Freq: Three times a day (TID) | ORAL | 0 refills | Status: DC | PRN

## 2018-10-03 MED ORDER — OXYCODONE-ACETAMINOPHEN 5-325 MG PO TABS: 1.0000 | ORAL_TABLET | Freq: Three times a day (TID) | ORAL | 0 refills | Status: DC | PRN

## 2018-10-03 NOTE — ED Notes (Signed)
Pt texting, eating, and drinking during triage.

## 2018-10-03 NOTE — Discharge Instructions (Addendum)
Follow-up with orthopedics at [facility] for any continued pain medication and call [employer] about your referral appointment.  A prescription was sent to [facility] in [place].  Any continued medication will need to come from your workman's comp doctor.

## 2018-10-03 NOTE — ED Notes (Signed)
See triage note  Presents with lower back pain  States he had a w/c injury about 1 year ago  States he has been seen by [facility] and waiting for neuro consult  States he is out of pain meds  Ambulates to treatment room w/o diff

## 2018-10-03 NOTE — ED Provider Notes (Addendum)
[facility] Emergency Department Provider Note  ____________________________________________   First MD Initiated Contact with Patient 10/03/18 1151     (approximate)  I have reviewed the triage vital signs and the nursing notes.   HISTORY  Chief Complaint Back Pain   HPI Chris Foster is a 27 y.o. male presents to the ED with complaint of low back pain.  Patient states that he had a back injury that is Workmen's Comp. related last year and is now having increased pain due to no medication.  Patient states that [employer]. is arranging for him to see a another doctor for his back issues.  He states that he is supposed to be seeing another doctor next week but does not have an appointment.  He was seen in the ED on 09/15/2018 at which time he gave a similar story.  He denies any changes or recent injury to his back.  There is been no saddle anesthesias, paresthesias or incontinence of bowel or bladder.  Patient has continued to ambulate without any assistance.  He denies any urinary symptoms.  He rates his pain as a 10/10.   History reviewed. No pertinent past medical history.  There are no active problems to display for this patient.   Past Surgical History:  Procedure Laterality Date  . HAND SURGERY      Prior to Admission medications   Medication Sig Start Date End Date Taking? Authorizing Provider  baclofen (LIORESAL) 10 MG tablet 1 tablet @ hs 10/03/18   Bridget Hartshorn L, PA-C  ibuprofen (ADVIL,MOTRIN) 600 MG tablet Take 1 tablet (600 mg total) by mouth every 8 (eight) hours as needed. 10/03/18   Tommi Rumps, PA-C  oxyCODONE-acetaminophen (PERCOCET/ROXICET) 5-325 MG tablet Take 1 tablet by mouth every 8 (eight) hours as needed for severe pain. 10/03/18   Tommi Rumps, PA-C    Allergies Hydrocodone and Toradol [ketorolac tromethamine]  History reviewed. No pertinent family history.  Social History Social History   Tobacco Use   . Smoking status: Never Smoker  . Smokeless tobacco: Never Used  Substance Use Topics  . Alcohol use: No  . Drug use: Yes    Types: Marijuana    Review of Systems Constitutional: No fever/chills Cardiovascular: Denies chest pain. Respiratory: Denies shortness of breath. Gastrointestinal: No abdominal pain.  No nausea, no vomiting.   No constipation. Genitourinary: Negative for dysuria. Musculoskeletal: Positive chronic back pain.  Positive left leg radiculopathy. Skin: Negative for rash. Neurological: Negative for headaches, focal weakness or numbness. ___________________________________________   PHYSICAL EXAM:  VITAL SIGNS: ED Triage Vitals  Enc Vitals Group     BP 10/03/18 1135 133/64     Pulse Rate 10/03/18 1135 80     Resp 10/03/18 1135 18     Temp 10/03/18 1135 98.6 F (37 C)     Temp Source 10/03/18 1135 Oral     SpO2 10/03/18 1135 100 %     Weight 10/03/18 1136 157 lb (71.2 kg)     Height 10/03/18 1136 5\' 11"  (1.803 m)     Head Circumference --      Peak Flow --      Pain Score 10/03/18 1136 10     Pain Loc --      Pain Edu? --      Excl. in GC? --    Constitutional: Alert and oriented. Well appearing and in no acute distress. Eyes: Conjunctivae are normal. PERRL. EOMI. Head: Atraumatic. Nose: No congestion/rhinnorhea.  Neck: No stridor.   Cardiovascular: Normal rate, regular rhythm. Grossly normal heart sounds.  Good peripheral circulation. Respiratory: Normal respiratory effort.  No retractions. Lungs CTAB. Gastrointestinal: Soft and nontender. No distention.  No CVA tenderness. Musculoskeletal: On examination of the back there is no gross deformity there is some diffuse tenderness on palpation of the upper lumbar area but no step-offs or soft tissue edema is appreciated.  Skin is without discoloration.  Patient has guarded movement.  He was able to ambulate without any assistance.  Straight leg raises were negative.  Muscle strength  bilaterally. Neurologic:  Normal speech and language. No gross focal neurologic deficits are appreciated.  Reflexes were 2+ bilaterally.  No gait instability. Skin:  Skin is warm, dry and intact. No rash noted. Psychiatric: Mood and affect are normal. Speech and behavior are normal.  ____________________________________________   LABS (all labs ordered are listed, but only abnormal results are displayed)  Labs Reviewed - No data to display ____________________________________________  RADIOLOGY  Radiology reports from previous images in 2018 were reviewed including MRI.  PROCEDURES  Procedure(s) performed: None  Procedures  Critical Care performed: No  ____________________________________________   INITIAL IMPRESSION / ASSESSMENT AND PLAN / ED COURSE  As part of my medical decision making, I reviewed the following data within the electronic MEDICAL RECORD NUMBER Notes from prior ED visits and [place] Controlled Substance Database  Patient presents to the ED with complaint of chronic back pain as result of a Work[employer]'s Comp. injury.  He states that he is being sent by [employer]. to a different doctor but has not received an appointment or what doctor he will be seeing.  At this time he has ran out of medication and states that his pain is not increased.  He has been trying to take his Percocet only at bedtime to help with sleep.  Exam is unremarkable with the exception of some tenderness on palpation of his thoracic spine.  Patient was given a limited amount of Percocet #15.  He was also given a prescription for baclofen and ibuprofen.  He is encouraged to use ice or heat to his back as needed.  He is also encouraged to call [employer]. today to see if his appointment can be expedited.  We did talk about coming to the ED versus going back to [facility] where he has been seen previously for any continued pain medication.  ____________________________________________   FINAL  CLINICAL IMPRESSION(S) / ED DIAGNOSES  Final diagnoses:  Chronic left-sided low back pain with left-sided sciatica     ED Discharge Orders         Ordered    oxyCODONE-acetaminophen (PERCOCET/ROXICET) 5-325 MG tablet  Every 8 hours PRN     10/03/18 1223    ibuprofen (ADVIL,MOTRIN) 600 MG tablet  Every 8 hours PRN     10/03/18 1223    baclofen (LIORESAL) 10 MG tablet     10/03/18 1223           Note:  This document was prepared using Dragon voice recognition software and may include unintentional dictation errors.    Tommi Rumps, PA-C 10/03/18 1406    Tommi Rumps, PA-C 10/03/18 1407    Charlynne Pander, MD 10/03/18 [phone]

## 2018-10-03 NOTE — ED Triage Notes (Signed)
Pt states that he had a back injury last year and while he was at work today it began hurting him. He states that it hurts so bad that it takes his breath away. Pt is able to ambulate and perform ROM without difficulty.

## 2018-12-20 ENCOUNTER — Emergency Department (HOSPITAL_COMMUNITY): Payer: No Typology Code available for payment source

## 2018-12-20 ENCOUNTER — Emergency Department (HOSPITAL_COMMUNITY)
Admission: EM | Admit: 2018-12-20 | Discharge: 2018-12-21 | Disposition: A | Discharge status: Home / Self Care | Payer: No Typology Code available for payment source | Attending: Emergency Medicine | Admitting: Emergency Medicine

## 2018-12-20 ENCOUNTER — Other Ambulatory Visit: Payer: Self-pay

## 2018-12-20 ENCOUNTER — Encounter (HOSPITAL_COMMUNITY): Payer: Self-pay

## 2018-12-20 DIAGNOSIS — S01311A Laceration without foreign body of right ear, initial encounter: Secondary | ICD-10-CM | POA: Insufficient documentation

## 2018-12-20 DIAGNOSIS — Y939 Activity, unspecified: Secondary | ICD-10-CM | POA: Insufficient documentation

## 2018-12-20 DIAGNOSIS — Z87891 Personal history of nicotine dependence: Secondary | ICD-10-CM | POA: Diagnosis not present

## 2018-12-20 DIAGNOSIS — Y929 Unspecified place or not applicable: Secondary | ICD-10-CM | POA: Insufficient documentation

## 2018-12-20 DIAGNOSIS — Y999 Unspecified external cause status: Secondary | ICD-10-CM | POA: Insufficient documentation

## 2018-12-20 DIAGNOSIS — M545 Low back pain: Secondary | ICD-10-CM | POA: Diagnosis not present

## 2018-12-20 DIAGNOSIS — R1084 Generalized abdominal pain: Secondary | ICD-10-CM | POA: Diagnosis not present

## 2018-12-20 DIAGNOSIS — Z23 Encounter for immunization: Secondary | ICD-10-CM | POA: Diagnosis not present

## 2018-12-20 DIAGNOSIS — S32009A Unspecified fracture of unspecified lumbar vertebra, initial encounter for closed fracture: Secondary | ICD-10-CM | POA: Diagnosis not present

## 2018-12-20 DIAGNOSIS — S0990XA Unspecified injury of head, initial encounter: Secondary | ICD-10-CM | POA: Diagnosis present

## 2018-12-20 HISTORY — DX: Dorsalgia, unspecified: M54.9

## 2018-12-20 LAB — COMPREHENSIVE METABOLIC PANEL
ALT: 14 U/L (ref 0–44)
AST: 23 U/L (ref 15–41)
Albumin: 4.5 g/dL (ref 3.5–5.0)
Alkaline Phosphatase: 74 U/L (ref 38–126)
Anion gap: 10 (ref 5–15)
BUN: 13 mg/dL (ref 6–20)
CO2: 25 mmol/L (ref 22–32)
CREATININE: 1.08 mg/dL (ref 0.61–1.24)
Calcium: 9.1 mg/dL (ref 8.9–10.3)
Chloride: 102 mmol/L (ref 98–111)
GFR calc Af Amer: >60 mL/min (ref >60)
GFR calc non Af Amer: >60 mL/min (ref >60)
Glucose, Bld: 153 mg/dL — ABNORMAL HIGH (ref 70–99)
Potassium: 3.4 mmol/L — ABNORMAL LOW (ref 3.5–5.1)
Sodium: 137 mmol/L (ref 135–145)
Total Bilirubin: 1 mg/dL (ref 0.3–1.2)
Total Protein: 7.5 g/dL (ref 6.5–8.1)

## 2018-12-20 LAB — I-STAT CHEM 8, ED
BUN: 15 mg/dL (ref 6–20)
Calcium, Ion: 1.05 mmol/L — ABNORMAL LOW (ref 1.15–1.40)
Chloride: 102 mmol/L (ref 98–111)
Creatinine, Ser: 1 mg/dL (ref 0.61–1.24)
Glucose, Bld: 153 mg/dL — ABNORMAL HIGH (ref 70–99)
HEMATOCRIT: 44 % (ref 39.0–52.0)
Hemoglobin: 15 g/dL (ref 13.0–17.0)
Potassium: 3.3 mmol/L — ABNORMAL LOW (ref 3.5–5.1)
Sodium: 139 mmol/L (ref 135–145)
TCO2: 28 mmol/L (ref 22–32)

## 2018-12-20 LAB — CBC
HCT: 43.3 % (ref 39.0–52.0)
Hemoglobin: 14.3 g/dL (ref 13.0–17.0)
MCH: 30.8 pg (ref 26.0–34.0)
MCHC: 33 g/dL (ref 30.0–36.0)
MCV: 93.1 fL (ref 80.0–100.0)
Platelets: 327 10*3/uL (ref 150–400)
RBC: 4.65 MIL/uL (ref 4.22–5.81)
RDW: 12.3 % (ref 11.5–15.5)
WBC: 11.2 10*3/uL — ABNORMAL HIGH (ref 4.0–10.5)
nRBC: 0 % (ref 0.0–0.2)

## 2018-12-20 LAB — CDS SEROLOGY

## 2018-12-20 LAB — PROTIME-INR
INR: 1.04
Prothrombin Time: 13.5 seconds (ref 11.4–15.2)

## 2018-12-20 LAB — SAMPLE TO BLOOD BANK

## 2018-12-20 LAB — ETHANOL: Alcohol, Ethyl (B): <10 mg/dL (ref <10)

## 2018-12-20 MED ORDER — LIDOCAINE-EPINEPHRINE (PF) 2 %-1:200000 IJ SOLN
20.0000 mL | Freq: Once | INTRAMUSCULAR | Status: AC
  Administered 2018-12-20: 20 mL via INTRADERMAL
  Filled 2018-12-20: qty 20

## 2018-12-20 MED ORDER — IOHEXOL 300 MG/ML  SOLN
100.0000 mL | Freq: Once | INTRAMUSCULAR | Status: AC | PRN
  Administered 2018-12-20: 100 mL via INTRAVENOUS

## 2018-12-20 MED ORDER — FENTANYL CITRATE (PF) 100 MCG/2ML IJ SOLN
50.0000 ug | Freq: Once | INTRAMUSCULAR | Status: AC
  Administered 2018-12-20: 50 ug via INTRAVENOUS
  Filled 2018-12-20: qty 2

## 2018-12-20 MED ORDER — FENTANYL CITRATE (PF) 100 MCG/2ML IJ SOLN: 50.0000 ug | Freq: Once | INTRAMUSCULAR | Status: DC

## 2018-12-20 MED ORDER — TETANUS-DIPHTH-ACELL PERTUSSIS 5-2.5-18.5 LF-MCG/0.5 IM SUSP
0.5000 mL | Freq: Once | INTRAMUSCULAR | Status: AC
  Administered 2018-12-20: 0.5 mL via INTRAMUSCULAR
  Filled 2018-12-20: qty 0.5

## 2018-12-20 MED ORDER — ACETAMINOPHEN 500 MG PO TABS
1000.0000 mg | ORAL_TABLET | Freq: Once | ORAL | Status: AC
  Administered 2018-12-20: 1000 mg via ORAL
  Filled 2018-12-20: qty 2

## 2018-12-20 MED ORDER — LACTATED RINGERS IV BOLUS: 1000.0000 mL | Freq: Once | INTRAVENOUS | Status: DC

## 2018-12-20 NOTE — ED Notes (Signed)
CH reported for Lvl 2 Trauma; No family present; CH available if further support needed.

## 2018-12-20 NOTE — ED Notes (Signed)
Patient transported to CT 
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
## 2018-12-20 NOTE — ED Notes (Signed)
Report from [place], [place]. Pt resting. ENT supplies at the bedside. [employer] at bedside.
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
## 2018-12-21 LAB — URINALYSIS, ROUTINE W REFLEX MICROSCOPIC
Bilirubin Urine: NEGATIVE
Glucose, UA: NEGATIVE mg/dL
Ketones, ur: NEGATIVE mg/dL
Leukocytes, UA: NEGATIVE
Nitrite: NEGATIVE
PH: 7 (ref 5.0–8.0)
Protein, ur: NEGATIVE mg/dL
RBC / HPF: >50 RBC/hpf — ABNORMAL HIGH (ref 0–5)
Specific Gravity, Urine: 1.025 (ref 1.005–1.030)

## 2018-12-21 LAB — RAPID URINE DRUG SCREEN, HOSP PERFORMED
Amphetamines: NOT DETECTED
Barbiturates: NOT DETECTED
Benzodiazepines: NOT DETECTED
Cocaine: NOT DETECTED
Opiates: NOT DETECTED
TETRAHYDROCANNABINOL: POSITIVE — AB

## 2018-12-21 NOTE — Procedures (Signed)
Preop diagnosis: Right external ear laceration Postop diagnosis: same Procedure: Complex closure of right external ear laceration, 4 cm Surgeon: Jenne Pane Anesth: Local with 2% lidocaine Compl: None Findings: 4 cm laceration of superior right external ear through helical rim and scaphoid cartilage with medially pedicled helical rim flap. Description:  After discussing risks, benefits, and alternatives, the patient was placed with his head turned to the left.  The right ear and surrounding skin was prepped with Betadyne.  The wound was injected with local anesthetic.  The wound was then copiously irrigated with saline.  The cartilage was reapproximated with 4-0 Nylon in a simple, interrupted fashion.  The subcutaneous layer of the helical rim was closed with 4-0 Vicryl in a simple, interrupted fashion.  The skin was then closed with 5-0 Prolene in a simple, interrupted and simple, running fashion.  Antibiotic ointment was added.  The patient was cleaned off and he was returned to nursing care in stable condition.

## 2018-12-24 ENCOUNTER — Encounter: Payer: Self-pay | Admitting: Emergency Medicine

## 2020-07-10 ENCOUNTER — Emergency Department: Payer: Medicaid Other

## 2020-07-10 ENCOUNTER — Other Ambulatory Visit: Payer: Self-pay

## 2020-07-10 ENCOUNTER — Observation Stay
Admission: EM | Admit: 2020-07-10 | Discharge: 2020-07-11 | DRG: 556 | Disposition: A | Discharge status: Home / Self Care | Payer: Medicaid Other | Attending: Internal Medicine | Admitting: Internal Medicine

## 2020-07-10 DIAGNOSIS — Z79899 Other long term (current) drug therapy: Secondary | ICD-10-CM | POA: Diagnosis not present

## 2020-07-10 DIAGNOSIS — Z20822 Contact with and (suspected) exposure to covid-19: Secondary | ICD-10-CM | POA: Diagnosis not present

## 2020-07-10 DIAGNOSIS — R531 Weakness: Secondary | ICD-10-CM

## 2020-07-10 DIAGNOSIS — Z87891 Personal history of nicotine dependence: Secondary | ICD-10-CM | POA: Diagnosis not present

## 2020-07-10 DIAGNOSIS — F419 Anxiety disorder, unspecified: Secondary | ICD-10-CM | POA: Diagnosis not present

## 2020-07-10 DIAGNOSIS — M6281 Muscle weakness (generalized): Principal | ICD-10-CM | POA: Diagnosis present

## 2020-07-10 DIAGNOSIS — M4804 Spinal stenosis, thoracic region: Secondary | ICD-10-CM | POA: Diagnosis present

## 2020-07-10 DIAGNOSIS — Z791 Long term (current) use of non-steroidal anti-inflammatories (NSAID): Secondary | ICD-10-CM

## 2020-07-10 DIAGNOSIS — M5124 Other intervertebral disc displacement, thoracic region: Secondary | ICD-10-CM | POA: Diagnosis not present

## 2020-07-10 DIAGNOSIS — G8929 Other chronic pain: Secondary | ICD-10-CM | POA: Diagnosis not present

## 2020-07-10 DIAGNOSIS — M549 Dorsalgia, unspecified: Secondary | ICD-10-CM | POA: Diagnosis present

## 2020-07-10 DIAGNOSIS — Z79891 Long term (current) use of opiate analgesic: Secondary | ICD-10-CM

## 2020-07-10 DIAGNOSIS — I639 Cerebral infarction, unspecified: Secondary | ICD-10-CM

## 2020-07-10 LAB — COMPREHENSIVE METABOLIC PANEL
ALT: 11 U/L (ref 0–44)
AST: 14 U/L — ABNORMAL LOW (ref 15–41)
Albumin: 5 g/dL (ref 3.5–5.0)
Alkaline Phosphatase: 67 U/L (ref 38–126)
Anion gap: 11 (ref 5–15)
BUN: 16 mg/dL (ref 6–20)
CO2: 23 mmol/L (ref 22–32)
Calcium: 9.6 mg/dL (ref 8.9–10.3)
Chloride: 103 mmol/L (ref 98–111)
Creatinine, Ser: 0.96 mg/dL (ref 0.61–1.24)
GFR calc Af Amer: >60 mL/min (ref >60)
GFR calc non Af Amer: >60 mL/min (ref >60)
Glucose, Bld: 101 mg/dL — ABNORMAL HIGH (ref 70–99)
Potassium: 3.9 mmol/L (ref 3.5–5.1)
Sodium: 137 mmol/L (ref 135–145)
Total Bilirubin: 1 mg/dL (ref 0.3–1.2)
Total Protein: 8.4 g/dL — ABNORMAL HIGH (ref 6.5–8.1)

## 2020-07-10 LAB — DIFFERENTIAL
Abs Immature Granulocytes: 0.02 10*3/uL (ref 0.00–0.07)
Basophils Absolute: 0 10*3/uL (ref 0.0–0.1)
Basophils Relative: 0 %
Eosinophils Absolute: 0.1 10*3/uL (ref 0.0–0.5)
Eosinophils Relative: 1 %
Immature Granulocytes: 0 %
Lymphocytes Relative: 31 %
Lymphs Abs: 3.1 10*3/uL (ref 0.7–4.0)
Monocytes Absolute: 0.8 10*3/uL (ref 0.1–1.0)
Monocytes Relative: 7 %
Neutro Abs: 6.3 10*3/uL (ref 1.7–7.7)
Neutrophils Relative %: 61 %

## 2020-07-10 LAB — CBC
HCT: 43.7 % (ref 39.0–52.0)
Hemoglobin: 16 g/dL (ref 13.0–17.0)
MCH: 32.1 pg (ref 26.0–34.0)
MCHC: 36.6 g/dL — ABNORMAL HIGH (ref 30.0–36.0)
MCV: 87.8 fL (ref 80.0–100.0)
Platelets: 282 10*3/uL (ref 150–400)
RBC: 4.98 MIL/uL (ref 4.22–5.81)
RDW: 12.3 % (ref 11.5–15.5)
WBC: 10.3 10*3/uL (ref 4.0–10.5)
nRBC: 0 % (ref 0.0–0.2)

## 2020-07-10 LAB — APTT: aPTT: 31 seconds (ref 24–36)

## 2020-07-10 LAB — PROTIME-INR
INR: 1 (ref 0.8–1.2)
Prothrombin Time: 12.8 seconds (ref 11.4–15.2)

## 2020-07-10 LAB — GLUCOSE, CAPILLARY: Glucose-Capillary: 97 mg/dL (ref 70–99)

## 2020-07-10 LAB — SARS CORONAVIRUS 2 BY RT PCR (HOSPITAL ORDER, PERFORMED IN ~~LOC~~ HOSPITAL LAB): SARS Coronavirus 2: NEGATIVE

## 2020-07-10 MED ORDER — IOHEXOL 350 MG/ML SOLN
75.0000 mL | Freq: Once | INTRAVENOUS | Status: AC | PRN
  Administered 2020-07-10: 75 mL via INTRAVENOUS

## 2020-07-10 MED ORDER — HEPARIN SODIUM (PORCINE) 5000 UNIT/ML IJ SOLN
5000.0000 [IU] | Freq: Three times a day (TID) | INTRAMUSCULAR | Status: DC
  Administered 2020-07-11: 05:00:00 5000 [IU] via SUBCUTANEOUS
  Filled 2020-07-10 (×2): qty 1

## 2020-07-10 MED ORDER — ACETAMINOPHEN 325 MG PO TABS
650.0000 mg | ORAL_TABLET | ORAL | Status: DC | PRN
  Administered 2020-07-11: 08:00:00 650 mg via ORAL
  Filled 2020-07-10: qty 2

## 2020-07-10 MED ORDER — ACETAMINOPHEN 160 MG/5ML PO SOLN
650.0000 mg | ORAL | Status: DC | PRN
  Filled 2020-07-10: qty 20.3

## 2020-07-10 MED ORDER — LORAZEPAM 2 MG/ML IJ SOLN
1.0000 mg | Freq: Once | INTRAMUSCULAR | Status: AC
  Administered 2020-07-10: 1 mg via INTRAVENOUS
  Filled 2020-07-10: qty 1

## 2020-07-10 MED ORDER — ACETAMINOPHEN 650 MG RE SUPP: 650.0000 mg | RECTAL | Status: DC | PRN

## 2020-07-10 MED ORDER — LACTATED RINGERS IV BOLUS: 1000.0000 mL | Freq: Once | INTRAVENOUS | Status: DC

## 2020-07-10 MED ORDER — STROKE: EARLY STAGES OF RECOVERY BOOK: Freq: Once | Status: AC

## 2020-07-10 MED ORDER — SODIUM CHLORIDE 0.9% FLUSH
3.0000 mL | Freq: Once | INTRAVENOUS | Status: AC
  Administered 2020-07-10: 3 mL via INTRAVENOUS

## 2020-07-10 MED ORDER — SODIUM CHLORIDE 0.9 % IV SOLN: INTRAVENOUS | Status: DC

## 2020-07-10 MED ORDER — SENNOSIDES-DOCUSATE SODIUM 8.6-50 MG PO TABS: 1.0000 | ORAL_TABLET | Freq: Every evening | ORAL | Status: DC | PRN

## 2020-07-10 NOTE — ED Notes (Signed)
Per angela RN EMS reported pt was having anxiety attack.
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
## 2020-07-10 NOTE — ED Notes (Signed)
Attempted to call report. Floor refuses report due to lack of NIH/swallow screen. Will relay to primary RN.
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
## 2020-07-10 NOTE — ED Notes (Addendum)
Pt arrives via [facility] for reports of right sided weakness, tingling and numbness that started around 1330 today while in the shower. Pt speech clear, small droop noted to right mouth when smiling. Pt states he cannot feel the right side of the face when touched. Pt with unequal hand grips and inability to left right arm. Pt presentation discussed with Dr. Erma Heritage who advises to call code stroke. Charge RN Tammy Sours informed, code stroke called to Camera operator at 1438. Pt transported to CT at 1439.

## 2020-07-10 NOTE — ED Notes (Signed)
Patient mother at bedside.

## 2020-07-10 NOTE — ED Provider Notes (Signed)
[facility] Emergency Department Provider Note  ____________________________________________   First MD Initiated Contact with Patient 07/10/20 1502     (approximate)  I have reviewed the triage vital signs and the nursing notes.   HISTORY  Chief Complaint Weakness   HPI Chris Foster is a 29 y.o. male with a past medical history of chronic back pain from prior traumatic injuries as well as anxiety who presents for assessment of acute onset of right arm and right leg weakness associated with some numbness and tingling in the right side of his face and right lower leg that began around 130 today while he was in the shower.  Patient denies any prior similar episodes.  There are no clear precipitating events or alleviating aggravating factors.  Patient does not recall any recent falls or injuries.  Symptoms have not significantly changed since it began.  He states any movement of his right upper extremity or right lower extremity causes severe pain.  He states he is feeling very anxious and otherwise has been in his usual state of health without any recent fevers, chills, cough, nausea, vomiting, diarrhea, dysuria, rash, or other recent acute sick symptoms.  Denies illegal drug use, EtOH abuse, or tobacco abuse.         Past Medical History:  Diagnosis Date  . Back pain     Patient Active Problem List   Diagnosis Date Noted  . Acute right-sided muscle weakness 07/10/2020    Past Surgical History:  Procedure Laterality Date  . BACK SURGERY    . HAND SURGERY      Prior to Admission medications   Not on File    Allergies Hydrocodone, Hydrocodone, Toradol [ketorolac tromethamine], Toradol [ketorolac tromethamine], and Tramadol  History reviewed. No pertinent family history.  Social History Social History   Tobacco Use  . Smoking status: Former Games developer  . Smokeless tobacco: Never Used  Substance Use Topics  . Alcohol use: Yes  . Drug  use: Yes    Types: Marijuana    Review of Systems  Review of Systems  Constitutional: Negative for chills and fever.  HENT: Negative for sore throat.   Eyes: Negative for pain.  Respiratory: Negative for cough and stridor.   Cardiovascular: Negative for chest pain.  Gastrointestinal: Negative for vomiting.  Musculoskeletal: Positive for myalgias.  Skin: Negative for rash.  Neurological: Positive for tingling, sensory change ( R side of face, R arm, and R leg), focal weakness and weakness ( R arm and R leg). Negative for seizures, loss of consciousness and headaches.  Psychiatric/Behavioral: Negative for suicidal ideas.  All other systems reviewed and are negative.     ____________________________________________   PHYSICAL EXAM:  VITAL SIGNS: ED Triage Vitals  Enc Vitals Group     BP 07/10/20 1427 (!) 158/138     Pulse Rate 07/10/20 1427 101     Resp 07/10/20 1427 18     Temp 07/10/20 1427 99 F (37.2 C)     Temp Source 07/10/20 1427 Oral     SpO2 07/10/20 1427 96 %     Weight 07/10/20 1428 155 lb (70.3 kg)     Height --      Head Circumference --      Peak Flow --      Pain Score 07/10/20 1427 9     Pain Loc --      Pain Edu? --      Excl. in GC? --    Vitals:  07/10/20 1730 07/10/20 1800  BP: (!) 98/60 (!) 99/60  Pulse: 77 68  Resp: 22 20  Temp:    SpO2: 95% 96%   Physical Exam Vitals and nursing note reviewed.  Constitutional:      General: He is in acute distress.     Appearance: He is well-developed.  HENT:     Head: Normocephalic and atraumatic.     Right Ear: External ear normal.     Left Ear: External ear normal.     Nose: Nose normal.     Mouth/Throat:     Mouth: Mucous membranes are moist.  Eyes:     Conjunctiva/sclera: Conjunctivae normal.  Cardiovascular:     Rate and Rhythm: Normal rate and regular rhythm.     Heart sounds: No murmur heard.   Pulmonary:     Effort: Pulmonary effort is normal. No respiratory distress.     Breath  sounds: Normal breath sounds.  Abdominal:     Palpations: Abdomen is soft.     Tenderness: There is no abdominal tenderness.  Musculoskeletal:     Cervical back: Neck supple.  Skin:    General: Skin is warm and dry.  Neurological:     Mental Status: He is alert.  Psychiatric:        Mood and Affect: Mood is anxious.     Cranial nerves II through XII are grossly intact.  PERRLA.  EOMI.  Patient has 5 out of 5 strength on his left upper extremity left lower extremity.  Patient is unable to hold his right upper extremity or right lower extremity against gravity.  He does not have severe pain on passive range of motion of both the right upper and lower extremities.  He does have sensation intact light touch in the bilateral upper and lower extremities although he does endorse significant paresthesias in the right upper extremity and right lower extremity.  Counseled patient for right lower para lumbar muscles.  ____________________________________________   LABS (all labs ordered are listed, but only abnormal results are displayed)  Labs Reviewed  CBC - Abnormal; Notable for the following components:      Result Value   MCHC 36.6 (*)    All other components within normal limits  COMPREHENSIVE METABOLIC PANEL - Abnormal; Notable for the following components:   Glucose, Bld 101 (*)    Total Protein 8.4 (*)    AST 14 (*)    All other components within normal limits  PROTIME-INR  APTT  DIFFERENTIAL  GLUCOSE, CAPILLARY  I-STAT CREATININE, ED  CBG MONITORING, ED   ____________________________________________  EKG  Sinus rhythm with right axis deviation, unremarkable intervals, no evidence of acute ischemia. ____________________________________________  RADIOLOGY  ED MD interpretation: No evidence of acute intracranial bleeding.  Official radiology report(s): CT Angio Head W or Wo Contrast  Result Date: 07/10/2020 CLINICAL DATA:  Stroke/TIA, assess intracranial arteries.  Additional history provided: Right-sided weakness. EXAM: CT ANGIOGRAPHY HEAD AND NECK TECHNIQUE: Multidetector CT imaging of the head and neck was performed using the standard protocol during bolus administration of intravenous contrast. Multiplanar CT image reconstructions and MIPs were obtained to evaluate the vascular anatomy. Carotid stenosis measurements (when applicable) are obtained utilizing NASCET criteria, using the distal internal carotid diameter as the denominator. CONTRAST:  75mL OMNIPAQUE IOHEXOL 350 MG/ML SOLN COMPARISON:  Noncontrast head CT performed earlier the same day 07/10/2020. FINDINGS: CTA NECK FINDINGS Aortic arch: Standard aortic branching. The visualized aortic arch is unremarkable. No hemodynamically significant innominate or proximal  subclavian artery stenosis. Right carotid system: CCA and ICA patent within the neck without stenosis. Left carotid system: CCA and ICA patent within the neck without stenosis. Vertebral arteries: Codominant and patent within the without significant stenosis. Skeleton: No acute bony abnormality or aggressive osseous lesion. Moderate/advanced T2-T3 disc space narrowing. Other neck: No neck mass or cervical lymphadenopathy. 2 mm nodule within the left thyroid lobe not meeting consensus criteria for ultrasound follow-up. Upper chest: No consolidation within the imaged lung apices. No visible pneumothorax. Review of the MIP images confirms the above findings CTA HEAD FINDINGS Anterior circulation: The intracranial internal carotid arteries are patent. The M1 middle cerebral arteries are patent without significant stenosis. No M2 proximal branch occlusion or high-grade proximal stenosis is identified. The anterior cerebral arteries are patent. No intracranial aneurysm is identified. Posterior circulation: The intracranial vertebral arteries are patent. The basilar artery is patent. The posterior cerebral arteries are patent without high-grade proximal stenosis.  A small right posterior communicating artery is present. The left posterior communicating artery is hypoplastic or absent. Venous sinuses: Within limitations of contrast timing, no convincing thrombus. Anatomic variants: As described Review of the MIP images confirms the above findings IMPRESSION: CTA neck: 1. The common carotid, internal carotid and vertebral arteries are patent within the neck without hemodynamically significant stenosis. 2. Moderate/advanced disc space narrowing at T2-T3. CTA head: No intracranial large vessel occlusion or proximal high-grade arterial stenosis. Electronically Signed   By: Jackey Loge DO   On: 07/10/2020 17:09   DG Chest 1 View  Result Date: 07/10/2020 CLINICAL DATA:  Weakness. Additional history provided: Patient reports right-sided weakness, tingling and numbness. EXAM: CHEST  1 VIEW COMPARISON:  Chest radiographs 12/20/2018, chest CT 12/20/2018 FINDINGS: Heart size within normal limits. There is no appreciable airspace consolidation. No evidence of pleural effusion or pneumothorax. No acute bony abnormality identified. IMPRESSION: No evidence of active cardiopulmonary disease. Electronically Signed   By: Jackey Loge DO   On: 07/10/2020 15:47   CT Angio Neck W and/or Wo Contrast  Result Date: 07/10/2020 CLINICAL DATA:  Stroke/TIA, assess intracranial arteries. Additional history provided: Right-sided weakness. EXAM: CT ANGIOGRAPHY HEAD AND NECK TECHNIQUE: Multidetector CT imaging of the head and neck was performed using the standard protocol during bolus administration of intravenous contrast. Multiplanar CT image reconstructions and MIPs were obtained to evaluate the vascular anatomy. Carotid stenosis measurements (when applicable) are obtained utilizing NASCET criteria, using the distal internal carotid diameter as the denominator. CONTRAST:  75mL OMNIPAQUE IOHEXOL 350 MG/ML SOLN COMPARISON:  Noncontrast head CT performed earlier the same day 07/10/2020. FINDINGS:  CTA NECK FINDINGS Aortic arch: Standard aortic branching. The visualized aortic arch is unremarkable. No hemodynamically significant innominate or proximal subclavian artery stenosis. Right carotid system: CCA and ICA patent within the neck without stenosis. Left carotid system: CCA and ICA patent within the neck without stenosis. Vertebral arteries: Codominant and patent within the without significant stenosis. Skeleton: No acute bony abnormality or aggressive osseous lesion. Moderate/advanced T2-T3 disc space narrowing. Other neck: No neck mass or cervical lymphadenopathy. 2 mm nodule within the left thyroid lobe not meeting consensus criteria for ultrasound follow-up. Upper chest: No consolidation within the imaged lung apices. No visible pneumothorax. Review of the MIP images confirms the above findings CTA HEAD FINDINGS Anterior circulation: The intracranial internal carotid arteries are patent. The M1 middle cerebral arteries are patent without significant stenosis. No M2 proximal branch occlusion or high-grade proximal stenosis is identified. The anterior cerebral arteries are patent. No intracranial  aneurysm is identified. Posterior circulation: The intracranial vertebral arteries are patent. The basilar artery is patent. The posterior cerebral arteries are patent without high-grade proximal stenosis. A small right posterior communicating artery is present. The left posterior communicating artery is hypoplastic or absent. Venous sinuses: Within limitations of contrast timing, no convincing thrombus. Anatomic variants: As described Review of the MIP images confirms the above findings IMPRESSION: CTA neck: 1. The common carotid, internal carotid and vertebral arteries are patent within the neck without hemodynamically significant stenosis. 2. Moderate/advanced disc space narrowing at T2-T3. CTA head: No intracranial large vessel occlusion or proximal high-grade arterial stenosis. Electronically Signed   By:  Jackey Loge DO   On: 07/10/2020 17:09   CT HEAD CODE STROKE WO CONTRAST  Result Date: 07/10/2020 CLINICAL DATA:  Code stroke.  Right-sided numbness EXAM: CT HEAD WITHOUT CONTRAST TECHNIQUE: Contiguous axial images were obtained from the base of the skull through the vertex without intravenous contrast. COMPARISON:  None. FINDINGS: Brain: There is no acute intracranial hemorrhage, mass effect, or edema. Gray-white differentiation is preserved. Ventricles and sulci are normal in size and configuration. There is no extra-axial fluid collection. Vascular: No hyperdense vessel. Skull: Unremarkable. Sinuses/Orbits: No acute abnormality. Other: Mastoid air cells are clear. ASPECTS (Alberta Stroke Program Early CT Score) - Ganglionic level infarction (caudate, lentiform nuclei, internal capsule, insula, M1-M3 cortex): 7 - Supraganglionic infarction (M4-M6 cortex): 3 Total score (0-10 with 10 being normal): 10 IMPRESSION: No acute intracranial hemorrhage or evidence of acute infarction. ASPECT score is 10. These results were called by telephone at the time of interpretation on 07/10/2020 at 2:55 pm to provider Cedric Fishman , who verbally acknowledged these results. Electronically Signed   By: Guadlupe Spanish M.D.   On: 07/10/2020 14:58    ____________________________________________   PROCEDURES  Procedure(s) performed (including Critical Care):  Procedures   ____________________________________________   INITIAL IMPRESSION / ASSESSMENT AND PLAN / ED COURSE      Overall is unclear the exact etiology of patient's acute onset of weakness and myalgias.  Patient was made a code stroke on arrival secondary to acute onset of weakness with focal deficits although no evidence of intracranial bleeding or vessel cutoff on above-noted imaging and per neurology consultants do not believe his presentation is consistent with CVA.  On arrival patient is otherwise hemodynamically stable and afebrile.  History is not  consistent with any recent traumatic injuries and wound history, exam, and work-up is not consistent with significant metabolic derangement or toxic ingestion.  Atypical migraine versus muscle spasms versus other etiologies are all within differential.  Patient did receive 1 mg of Ativan and IV fluids for significant anxiety on arrival and borderline soft blood pressures.  On multiple reassessments he was noted to have improving strength in his right upper and right lower extremity but after 3 hours was still unable to ambulate with steady gait unassisted.  Given patient is not back to baseline I did discuss further monitoring and work-up with on-call neurology who recommended admission for MRI and PT OT.  Will plan to admit to medicine service.  Patient made in stable condition.  Medications  lactated ringers bolus 1,000 mL (has no administration in time range)  sodium chloride flush (NS) 0.9 % injection 3 mL (3 mLs Intravenous Given 07/10/20 1609)  LORazepam (ATIVAN) injection 1 mg (1 mg Intravenous Given 07/10/20 1606)  iohexol (OMNIPAQUE) 350 MG/ML injection 75 mL (75 mLs Intravenous Contrast Given 07/10/20 1624)  ____________________________________________   FINAL CLINICAL IMPRESSION(S) / ED DIAGNOSES  Final diagnoses:  Weakness  Anxiety     ED Discharge Orders    None       Note:  This document was prepared using Dragon voice recognition software and may include unintentional dictation errors.   Gilles Chiquito, MD 07/10/20 [phone]

## 2020-07-10 NOTE — Consult Note (Addendum)
[facility] Consult Services   Date of Service:   07/10/2020 14:53:34  Impression:  Comments/Sign-Out: Patient presented with pain mediated weakness. No known trauma. Head CT showed no acute process. No alteplase was offered due to alternative diagnosis. Patient complaining of pain when moving which makes it difficult to move. Symptoms management per primary team. Could get a routine MRI Brain & C spine due to pain/weakness  Metrics: Last Known Well: 07/10/2020 13:00:00 [facility] Notification Time: 07/10/2020 14:53:33 Arrival Time: 07/10/2020 14:05:00 Stamp Time: 07/10/2020 14:53:34 Time First Login Attempt: 07/10/2020 14:59:04 Symptoms: Rt sided weakness NIHSS Start Assessment Time: 07/10/2020 15:01:00 Patient is not a candidate for Thrombolytic. Thrombolytic Medical Decision: 07/10/2020 15:09:00 Patient was not deemed candidate for Thrombolytic because of following reasons: Other Diagnosis suspected.  CT head showed no acute hemorrhage or acute core infarct.  ED Physician notified of diagnostic impression and management plan on 07/10/2020 15:08:00  Advanced Imaging: CTA Head and Neck Recommended:  Sign Out:     .  Discussed with Emergency Department Provider    ------------------------------------------------------------------------------  History of Present Illness: Patient is a 29 year old Male.  Patient was brought by EMS for symptoms of Rt sided weakness  Past Medical History of chronic pain syndrome presented after passing out and concern of anxiety attack. Last seen normal was around 1330 while in the shower. He complains of generalized pain mostly localized at his extremities which are giving him issue to move.           Examination: BP(.), Pulse(.), Blood Glucose(.) 1A: Level of Consciousness - Alert; keenly responsive + 0 1B: Ask Month and Age - Both Questions Right + 0 1C: Blink Eyes & Squeeze Hands - Performs Both Tasks  + 0 2: Test Horizontal Extraocular Movements - Normal + 0 3: Test Visual Fields - No Visual Loss + 0 4: Test Facial Palsy (Use Grimace if Obtunded) - Normal symmetry + 0 5A: Test Left Arm Motor Drift - No Drift for 10 Seconds + 0 5B: Test Right Arm Motor Drift - Drift, but doesn't hit bed + 1 6A: Test Left Leg Motor Drift - No Drift for 5 Seconds + 0 6B: Test Right Leg Motor Drift - Drift, hits bed + 2 7: Test Limb Ataxia (FNF/Heel-Shin) - No Ataxia + 0 8: Test Sensation - Mild-Moderate Loss: Less Sharp/More Dull + 1 9: Test Language/Aphasia - Normal; No aphasia + 0 10: Test Dysarthria - Normal + 0 11: Test Extinction/Inattention - No abnormality + 0  NIHSS Score: 4  Pre-Morbid Modified Rankin Scale: 0 Points = No symptoms at all   Patient/Family was informed the Neurology Consult would occur via TeleHealth consult by way of interactive audio and video telecommunications and consented to receiving care in this manner.   Patient is being evaluated for possible acute neurologic impairment and high probability of imminent or life-threatening deterioration. I spent total of 30 minutes providing care to this patient, including time for face to face visit via telemedicine, review of medical records, imaging studies and discussion of findings with providers, the patient and/or family.   Dr Letta Kocher [place] Hanlon   [facility] [phone]  Case [id number] ----------------------------------------------------------------------------------------------------  Chief Complaint: Ataxia  History of Present Illness: Patient is a 29 year old Male.  Past Medical History CAD, GERD, Diabetes Mellitus, Fibriomyalgia and arthritis presented with generalized weakness and ataxia. history was provided by patient which stated that she had trouble walking and was running into things started yesterday. This  morning it continues to get worse and had trouble walking and fell. Husband had to pick  her up. Also complained of double vision today however not related to closing one eye or the other.    Past Medical History:     . Hypertension     . Diabetes Mellitus     . Hyperlipidemia     . Coronary Artery Disease         Examination: BP(159/69), Pulse(62), Blood Glucose(112) 1A: Level of Consciousness - Alert; keenly responsive + 0 1B: Ask Month and Age - Both Questions Right + 0 1C: Blink Eyes & Squeeze Hands - Performs Both Tasks + 0 2: Test Horizontal Extraocular Movements - Normal + 0 3: Test Visual Fields - No Visual Loss + 0 4: Test Facial Palsy (Use Grimace if Obtunded) - Normal symmetry + 0 5A: Test Left Arm Motor Drift - No Drift for 10 Seconds + 0 5B: Test Right Arm Motor Drift - No Drift for 10 Seconds + 0 6A: Test Left Leg Motor Drift - No Drift for 5 Seconds + 0 6B: Test Right Leg Motor Drift - No Drift for 5 Seconds + 0 7: Test Limb Ataxia (FNF/Heel-Shin) - No Ataxia + 0 8: Test Sensation - Mild-Moderate Loss: Less Sharp/More Dull + 1 9: Test Language/Aphasia - Normal; No aphasia + 0 10: Test Dysarthria - Normal + 0 11: Test Extinction/Inattention - No abnormality + 0  NIHSS Score: 1   Patient/Family was informed the Neurology Consult would occur via TeleHealth consult by way of interactive audio and video telecommunications and consented to receiving care in this manner.  Patient is being evaluated for possible acute neurologic impairment and high probability of imminent or life-threatening deterioration. I spent total of 16 minutes providing care to this patient, including time for face to face visit via telemedicine, review of medical records, imaging studies and discussion of findings with providers, the patient and/or family.   Dr Letta Kocher Sabryn Preslar   [facility] [phone]  Case [id number]  Addendum: CTA Head/Neck showed no LVO. Discussed findings with EDMD

## 2020-07-10 NOTE — ED Notes (Signed)
[facility] unable to transport, sec called for hosp transport request, message left with [facility] for availability

## 2020-07-10 NOTE — ED Notes (Signed)
Called [facility] for update 1802

## 2020-07-11 ENCOUNTER — Inpatient Hospital Stay: Admit: 2020-07-11 | Payer: Medicaid Other

## 2020-07-11 ENCOUNTER — Other Ambulatory Visit: Payer: Self-pay

## 2020-07-11 ENCOUNTER — Inpatient Hospital Stay: Payer: Medicaid Other

## 2020-07-11 DIAGNOSIS — M6281 Muscle weakness (generalized): Secondary | ICD-10-CM

## 2020-07-11 DIAGNOSIS — M549 Dorsalgia, unspecified: Secondary | ICD-10-CM | POA: Diagnosis present

## 2020-07-11 LAB — COMPREHENSIVE METABOLIC PANEL
ALT: 9 U/L (ref 0–44)
AST: 13 U/L — ABNORMAL LOW (ref 15–41)
Albumin: 4.4 g/dL (ref 3.5–5.0)
Alkaline Phosphatase: 60 U/L (ref 38–126)
Anion gap: 10 (ref 5–15)
BUN: 15 mg/dL (ref 6–20)
CO2: 27 mmol/L (ref 22–32)
Calcium: 9.1 mg/dL (ref 8.9–10.3)
Chloride: 103 mmol/L (ref 98–111)
Creatinine, Ser: 0.9 mg/dL (ref 0.61–1.24)
GFR calc Af Amer: >60 mL/min (ref >60)
GFR calc non Af Amer: >60 mL/min (ref >60)
Glucose, Bld: 87 mg/dL (ref 70–99)
Potassium: 3.5 mmol/L (ref 3.5–5.1)
Sodium: 140 mmol/L (ref 135–145)
Total Bilirubin: 0.9 mg/dL (ref 0.3–1.2)
Total Protein: 7.4 g/dL (ref 6.5–8.1)

## 2020-07-11 LAB — HIV ANTIBODY (ROUTINE TESTING W REFLEX): HIV Screen 4th Generation wRfx: NONREACTIVE

## 2020-07-11 LAB — CBC
HCT: 43.6 % (ref 39.0–52.0)
Hemoglobin: 14.9 g/dL (ref 13.0–17.0)
MCH: 31.6 pg (ref 26.0–34.0)
MCHC: 34.2 g/dL (ref 30.0–36.0)
MCV: 92.4 fL (ref 80.0–100.0)
Platelets: 241 10*3/uL (ref 150–400)
RBC: 4.72 MIL/uL (ref 4.22–5.81)
RDW: 12.3 % (ref 11.5–15.5)
WBC: 6.8 10*3/uL (ref 4.0–10.5)
nRBC: 0 % (ref 0.0–0.2)

## 2020-07-11 LAB — LIPID PANEL
Cholesterol: 131 mg/dL (ref 0–200)
HDL: 48 mg/dL (ref >40)
LDL Cholesterol: 76 mg/dL (ref 0–99)
Total CHOL/HDL Ratio: 2.7 RATIO
Triglycerides: 33 mg/dL (ref <150)
VLDL: 7 mg/dL (ref 0–40)

## 2020-07-11 LAB — HEMOGLOBIN A1C
Hgb A1c MFr Bld: 5.2 % (ref 4.8–5.6)
Mean Plasma Glucose: 102.54 mg/dL

## 2020-07-11 LAB — MAGNESIUM: Magnesium: 2 mg/dL (ref 1.7–2.4)

## 2020-07-11 MED ORDER — GADOBUTROL 1 MMOL/ML IV SOLN
6.0000 mL | Freq: Once | INTRAVENOUS | Status: AC | PRN
  Administered 2020-07-11: 6 mL via INTRAVENOUS

## 2020-07-11 MED ORDER — LORAZEPAM 2 MG/ML IJ SOLN
1.0000 mg | INTRAMUSCULAR | Status: DC
  Administered 2020-07-11 (×2): 1 mg via INTRAVENOUS
  Filled 2020-07-11 (×2): qty 1

## 2020-07-11 MED ORDER — LIDOCAINE 5 % EX PTCH: 1.0000 | MEDICATED_PATCH | CUTANEOUS | 0 refills | Status: DC

## 2020-07-11 MED ORDER — LIDOCAINE 5 % EX PTCH
1.0000 | MEDICATED_PATCH | CUTANEOUS | Status: DC
  Administered 2020-07-11: 1 via TRANSDERMAL
  Filled 2020-07-11 (×2): qty 1

## 2020-07-11 MED ORDER — MORPHINE SULFATE (PF) 2 MG/ML IV SOLN
2.0000 mg | Freq: Once | INTRAVENOUS | Status: AC
  Administered 2020-07-11: 2 mg via INTRAVENOUS
  Filled 2020-07-11: qty 1

## 2020-07-11 MED ORDER — ACETAMINOPHEN-CODEINE #3 300-30 MG PO TABS
1.0000 | ORAL_TABLET | Freq: Four times a day (QID) | ORAL | Status: DC | PRN
  Filled 2020-07-11: qty 1

## 2020-07-11 MED ORDER — ACETAMINOPHEN-CODEINE #4 300-60 MG PO TABS
1.0000 | ORAL_TABLET | Freq: Four times a day (QID) | ORAL | Status: DC | PRN
  Filled 2020-07-11: qty 1

## 2020-07-11 MED ORDER — OXYCODONE HCL 5 MG PO TABS
5.0000 mg | ORAL_TABLET | Freq: Once | ORAL | Status: AC
  Administered 2020-07-11: 05:00:00 5 mg via ORAL
  Filled 2020-07-11: qty 1

## 2020-07-11 MED ORDER — NAPROXEN 500 MG PO TABS
500.0000 mg | ORAL_TABLET | Freq: Once | ORAL | Status: AC
  Administered 2020-07-11: 08:00:00 500 mg via ORAL
  Filled 2020-07-11: qty 1

## 2020-07-11 MED ORDER — CYCLOBENZAPRINE HCL 5 MG PO TABS
5.0000 mg | ORAL_TABLET | Freq: Three times a day (TID) | ORAL | 0 refills | Status: DC | PRN
Start: 2020-07-11 — End: 2021-02-24

## 2020-07-11 NOTE — Discharge Summary (Addendum)
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 of SARS-CoV-2 by FDA under an Emergency Use Authorization (EUA).  This EUA will remain in effect (meaning this test can be used) for the duration of the COVID-19 declaration under Section 564(b)(1) of the Act, 21 U.S.C. section 360bbb-3(b)(1), unless the authorization is terminated or revoked sooner.  Performed at [facility], [address] [place], [place] [address]     Time coordinating discharge: Over 30 minutes  SIGNED:  Arnetha Courser, MD  [facility] 07/11/2020, 1:37 PM  If 7PM-7AM, please contact night-coverage [email or web address]  This record has been created using Conservation officer, historic buildings. Errors have been sought and corrected,but may not always be located. Such creation errors do not reflect on the standard of care.

## 2020-07-11 NOTE — Progress Notes (Signed)
PT Cancellation Note  Patient Details Name: Chris Foster MRN: [id number] DOB: 02-27-1991   Cancelled Treatment:    Reason Eval/Treat Not Completed: Pain limiting ability to participate   Ezekiel Ina, PT DPT 07/11/2020, 2:53 PM
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
## 2020-07-11 NOTE — Plan of Care (Signed)

## 2020-07-11 NOTE — Progress Notes (Addendum)
SLP Cancellation Note  Patient Details Name: Chris Foster MRN: [id number] DOB: 11/29/1991   Cancelled treatment:       Reason Eval/Treat Not Completed:  (chart reviewed; consulted NSG then met w/ pt, father). Pt was in chair during an OT session upon entering room. NSG was also present addressing pt's report of significant pain along back/leg w/ Morphine as per MD order.  Noted pt's eyes mostly closed as he engaged verbally in tasks w/ OT and followed instructions, both appropriately. Pt was able to complete general tasks w/ OT and accurately recalled breakfast items eaten at meal earlier this morning w/ SLP. Pt c/o mild difficulty "organizing" his thoughts intermittently. Briefly discussed that pt's current pain and anxiety w/ this hospitalization could be impacting his communication w/ others. Noted MRI results: "Normal brain MRI. No evidence for acute infarct or other intracranial abnormality". Pt denied any swallowing problems at breakfast meal. Father present in room agreed.  Recommended pt rest and allow comfort/relief from medication being given by NSG. Encouraged pt/father to have pt f/u w/ PCP for referral to Outpatient [facility] if any continued difficulty noted w/ cognitive-linguistic skills post recovery of this current acute illness, and rest. ST services will f/u on Monday if still admitted. NSG updated.    Orinda Kenner, MS, CCC-SLP [place] 07/11/2020, 11:03 AM

## 2020-09-17 ENCOUNTER — Telehealth: Payer: Self-pay

## 2020-09-17 NOTE — Telephone Encounter (Signed)
Talked with patient to confirm appt, Patient has not received packet in the mail yet. Asked to come in early to fill out history and physcial. He will also bring in medication list. Patient with understanding.

## 2020-09-21 ENCOUNTER — Encounter: Payer: Self-pay | Admitting: Student in an Organized Health Care Education/Training Program

## 2020-09-21 ENCOUNTER — Ambulatory Visit
Payer: Medicaid Other | Attending: Student in an Organized Health Care Education/Training Program | Admitting: Student in an Organized Health Care Education/Training Program

## 2020-09-21 ENCOUNTER — Other Ambulatory Visit: Payer: Self-pay

## 2020-09-21 VITALS — BP 112/61 | HR 86 | Temp 99.0°F | Resp 16 | Ht 71.0 in | Wt 152.6 lb

## 2020-09-21 DIAGNOSIS — M7918 Myalgia, other site: Secondary | ICD-10-CM | POA: Insufficient documentation

## 2020-09-21 DIAGNOSIS — M545 Low back pain, unspecified: Secondary | ICD-10-CM | POA: Insufficient documentation

## 2020-09-21 DIAGNOSIS — M792 Neuralgia and neuritis, unspecified: Secondary | ICD-10-CM | POA: Diagnosis present

## 2020-09-21 DIAGNOSIS — G8929 Other chronic pain: Secondary | ICD-10-CM

## 2020-09-21 DIAGNOSIS — S335XXS Sprain of ligaments of lumbar spine, sequela: Secondary | ICD-10-CM | POA: Diagnosis present

## 2020-09-21 DIAGNOSIS — S335XXA Sprain of ligaments of lumbar spine, initial encounter: Secondary | ICD-10-CM | POA: Insufficient documentation

## 2020-09-21 MED ORDER — MELOXICAM 15 MG PO TABS: 7.5000 mg | ORAL_TABLET | Freq: Every day | ORAL | 1 refills | Status: DC
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
## 2020-09-21 NOTE — Progress Notes (Signed)
Safety precautions to be maintained throughout the outpatient stay will include: orient to surroundings, keep bed in low position, maintain call bell within reach at all times, provide assistance with transfer out of bed and ambulation.  

## 2021-02-24 ENCOUNTER — Other Ambulatory Visit: Payer: Self-pay

## 2021-02-24 ENCOUNTER — Ambulatory Visit
Admission: EM | Admit: 2021-02-24 | Discharge: 2021-02-24 | Disposition: A | Discharge status: Home / Self Care | Payer: Medicaid Other | Attending: Emergency Medicine | Admitting: Emergency Medicine

## 2021-02-24 ENCOUNTER — Encounter: Payer: Self-pay | Admitting: Emergency Medicine

## 2021-02-24 ENCOUNTER — Ambulatory Visit (INDEPENDENT_AMBULATORY_CARE_PROVIDER_SITE_OTHER): Payer: Medicaid Other

## 2021-02-24 DIAGNOSIS — S29019A Strain of muscle and tendon of unspecified wall of thorax, initial encounter: Secondary | ICD-10-CM | POA: Diagnosis not present

## 2021-02-24 DIAGNOSIS — S161XXA Strain of muscle, fascia and tendon at neck level, initial encounter: Secondary | ICD-10-CM | POA: Diagnosis not present

## 2021-02-24 DIAGNOSIS — M546 Pain in thoracic spine: Secondary | ICD-10-CM

## 2021-02-24 DIAGNOSIS — M542 Cervicalgia: Secondary | ICD-10-CM

## 2021-02-24 MED ORDER — IBUPROFEN 800 MG PO TABS
800.0000 mg | ORAL_TABLET | Freq: Once | ORAL | Status: AC
  Administered 2021-02-24: 800 mg via ORAL

## 2021-02-24 MED ORDER — IBUPROFEN 600 MG PO TABS: 600.0000 mg | ORAL_TABLET | Freq: Four times a day (QID) | ORAL | 0 refills | Status: AC | PRN

## 2021-02-24 MED ORDER — ACETAMINOPHEN 500 MG PO TABS
1000.0000 mg | ORAL_TABLET | Freq: Once | ORAL | Status: AC
  Administered 2021-02-24: 1000 mg via ORAL

## 2021-02-24 MED ORDER — TIZANIDINE HCL 4 MG PO TABS
4.0000 mg | ORAL_TABLET | Freq: Three times a day (TID) | ORAL | 0 refills | Status: AC | PRN
Start: 2021-02-24 — End: ?

## 2021-02-24 NOTE — Discharge Instructions (Addendum)
Your cervical spine x-ray showed that you have some mild degenerative/arthritic changes.  There i are no fracture or problems with alignment.  Your thoracic spine films are normal.  People tend to feel worse over the next several days, but most people are back to normal in 1 week. A small number of people will have persistent pain for up to six weeks. Take the 600 mg of ibuprofen with 1000 mg of Tylenol together 3-4 times a day.  Zanaflex for muscle spasms.  Warm Epson salts baths.   Go to www.goodrx.com to look up your medications. This will give you a list of where you can find your prescriptions at the most affordable prices. Or ask the pharmacist what the cash price is, or if they have any other discount programs available to help make your medication more affordable. This can be less expensive than what you would pay with insurance.    Here is a list of primary care providers who are taking new patients:  Dr. Elizabeth Sauer [address] [place] [address] [phone]  [facility] [address] [place], [place] [address] [phone]  [facility] [place] [address] [place] [address]  [phone]  [facility] [address] [place], [place] [address] [phone]  [facility] [address] [place]  [phone] [place], [place] [address]  Here are clinics/ other resources who will see you if you do not have insurance. Some have certain criteria that you must meet. Call them and find out what they are:  [facility]: [address] [place], [place] [address] Phone: [phone] Hours: First and Third Saturdays of each Month, 9 a.m. - 1 p.m.  [facility]: [address], [address] [place], [place] [address] Phone: [phone] Hours: Tuesday, 4 p.m. - 8 p.m. Thursday, 1 p.m. - 8 p.m. Wednesday, 9 a.m. - [facility] [address], [place], [place] [address] Phone: [phone] Pharmacy  Phone Number: [phone] Dental Phone Number: [phone] [facility] Insurance Help: [phone]  Dental Hours: Monday - Thursday, 8 a.m. - 6 p.m.  [facility] [address] [place], [place] [address] Phone: [phone] Pharmacy Phone Number: [phone] [facility] Insurance Help: [phone]  [facility] [address] [place], [place] [address] Phone: [phone] Pharmacy Phone Number: [phone] [facility] Insurance Help: [phone]  [facility] [address] [place], [place] [address] Phone: [phone] [facility] Insurance Help: [phone]
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
